# Patient Record
Sex: Female | Born: 1995 | Race: White | Hispanic: No | Marital: Single | Smoking: Former smoker
Health system: Southern US, Community
[De-identification: ages and names within clinical notes are randomized; demographics above are authoritative.]

## PROBLEM LIST (undated history)

## (undated) ENCOUNTER — Inpatient Hospital Stay (HOSPITAL_COMMUNITY): Payer: Self-pay

## (undated) DIAGNOSIS — F419 Anxiety disorder, unspecified: Secondary | ICD-10-CM

## (undated) DIAGNOSIS — F32A Depression, unspecified: Secondary | ICD-10-CM

## (undated) DIAGNOSIS — F329 Major depressive disorder, single episode, unspecified: Secondary | ICD-10-CM

## (undated) DIAGNOSIS — B999 Unspecified infectious disease: Secondary | ICD-10-CM

## (undated) HISTORY — PX: MYRINGOTOMY WITH TUBE PLACEMENT: SHX5663

## (undated) HISTORY — PX: TONSILLECTOMY: SUR1361

---
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
## 2015-01-10 ENCOUNTER — Encounter (HOSPITAL_COMMUNITY): Payer: Self-pay | Admitting: Family Medicine

## 2015-01-10 ENCOUNTER — Emergency Department (HOSPITAL_COMMUNITY)
Admission: EM | Admit: 2015-01-10 | Discharge: 2015-01-10 | Discharge status: Against Medical Advice | Payer: Medicaid Other | Attending: Emergency Medicine | Admitting: Emergency Medicine

## 2015-01-10 DIAGNOSIS — J45909 Unspecified asthma, uncomplicated: Secondary | ICD-10-CM | POA: Diagnosis not present

## 2015-01-10 DIAGNOSIS — Z202 Contact with and (suspected) exposure to infections with a predominantly sexual mode of transmission: Secondary | ICD-10-CM | POA: Diagnosis not present

## 2015-01-10 NOTE — ED Notes (Signed)
Registration states they saw patient leave. Patient LWBS at this time
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
## 2015-01-10 NOTE — ED Notes (Signed)
Patient called to treatment room x1, no answer.  

## 2015-01-20 ENCOUNTER — Emergency Department (HOSPITAL_BASED_OUTPATIENT_CLINIC_OR_DEPARTMENT_OTHER)
Admission: EM | Admit: 2015-01-20 | Discharge: 2015-01-20 | Disposition: A | Discharge status: Home / Self Care | Payer: Medicaid Other | Attending: Physician Assistant | Admitting: Physician Assistant

## 2015-01-20 ENCOUNTER — Emergency Department (HOSPITAL_BASED_OUTPATIENT_CLINIC_OR_DEPARTMENT_OTHER): Payer: Medicaid Other

## 2015-01-20 ENCOUNTER — Encounter (HOSPITAL_BASED_OUTPATIENT_CLINIC_OR_DEPARTMENT_OTHER): Payer: Self-pay | Admitting: Emergency Medicine

## 2015-01-20 DIAGNOSIS — Z202 Contact with and (suspected) exposure to infections with a predominantly sexual mode of transmission: Secondary | ICD-10-CM | POA: Diagnosis not present

## 2015-01-20 DIAGNOSIS — W228XXA Striking against or struck by other objects, initial encounter: Secondary | ICD-10-CM | POA: Diagnosis not present

## 2015-01-20 DIAGNOSIS — Z3202 Encounter for pregnancy test, result negative: Secondary | ICD-10-CM | POA: Diagnosis not present

## 2015-01-20 DIAGNOSIS — J45909 Unspecified asthma, uncomplicated: Secondary | ICD-10-CM | POA: Insufficient documentation

## 2015-01-20 DIAGNOSIS — Y9389 Activity, other specified: Secondary | ICD-10-CM | POA: Insufficient documentation

## 2015-01-20 DIAGNOSIS — Z711 Person with feared health complaint in whom no diagnosis is made: Secondary | ICD-10-CM

## 2015-01-20 DIAGNOSIS — Y9289 Other specified places as the place of occurrence of the external cause: Secondary | ICD-10-CM | POA: Insufficient documentation

## 2015-01-20 DIAGNOSIS — R05 Cough: Secondary | ICD-10-CM

## 2015-01-20 DIAGNOSIS — Y998 Other external cause status: Secondary | ICD-10-CM | POA: Diagnosis not present

## 2015-01-20 DIAGNOSIS — R059 Cough, unspecified: Secondary | ICD-10-CM

## 2015-01-20 DIAGNOSIS — J029 Acute pharyngitis, unspecified: Secondary | ICD-10-CM | POA: Diagnosis not present

## 2015-01-20 DIAGNOSIS — Z8659 Personal history of other mental and behavioral disorders: Secondary | ICD-10-CM | POA: Insufficient documentation

## 2015-01-20 DIAGNOSIS — R042 Hemoptysis: Secondary | ICD-10-CM | POA: Diagnosis present

## 2015-01-20 DIAGNOSIS — S00512A Abrasion of oral cavity, initial encounter: Secondary | ICD-10-CM | POA: Diagnosis not present

## 2015-01-20 LAB — URINALYSIS, ROUTINE W REFLEX MICROSCOPIC
Bilirubin Urine: NEGATIVE
Glucose, UA: NEGATIVE mg/dL
Hgb urine dipstick: NEGATIVE
KETONES UR: NEGATIVE mg/dL
Nitrite: NEGATIVE
PH: 8.5 — AB (ref 5.0–8.0)
Protein, ur: NEGATIVE mg/dL
SPECIFIC GRAVITY, URINE: 1.018 (ref 1.005–1.030)
Urobilinogen, UA: 1 mg/dL (ref 0.0–1.0)

## 2015-01-20 LAB — URINE MICROSCOPIC-ADD ON

## 2015-01-20 LAB — PREGNANCY, URINE: Preg Test, Ur: NEGATIVE

## 2015-01-20 LAB — RAPID STREP SCREEN (MED CTR MEBANE ONLY): Streptococcus, Group A Screen (Direct): NEGATIVE

## 2015-01-20 MED ORDER — BENZONATATE 100 MG PO CAPS: 100.0000 mg | ORAL_CAPSULE | Freq: Three times a day (TID) | ORAL | Status: DC

## 2015-01-20 MED ORDER — ALBUTEROL SULFATE HFA 108 (90 BASE) MCG/ACT IN AERS: 1.0000 | INHALATION_SPRAY | Freq: Four times a day (QID) | RESPIRATORY_TRACT | Status: DC | PRN

## 2015-01-20 MED ORDER — CEFTRIAXONE SODIUM 250 MG IJ SOLR
250.0000 mg | Freq: Once | INTRAMUSCULAR | Status: AC
  Administered 2015-01-20: 250 mg via INTRAMUSCULAR
  Filled 2015-01-20: qty 250

## 2015-01-20 MED ORDER — AZITHROMYCIN 250 MG PO TABS
1000.0000 mg | ORAL_TABLET | Freq: Once | ORAL | Status: AC
  Administered 2015-01-20: 1000 mg via ORAL
  Filled 2015-01-20: qty 4

## 2015-01-20 MED ORDER — LIDOCAINE HCL (PF) 1 % IJ SOLN
INTRAMUSCULAR | Status: AC
  Administered 2015-01-20: 5 mL
  Filled 2015-01-20: qty 5

## 2015-01-20 NOTE — ED Notes (Signed)
Patient states that for the last 2 -3 days she has a had a sore throat. Today she started to have cough with blood at times and reports that she has "bump at the back of my throat"

## 2015-01-20 NOTE — Discharge Instructions (Signed)
Take the prescribed medication as directed. °Follow-up with your primary care physician. °Return to the ED for new or worsening symptoms. ° °
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
## 2015-01-21 LAB — URINE CULTURE: Culture: 6000

## 2015-01-24 LAB — CULTURE, GROUP A STREP: Strep A Culture: NEGATIVE
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
## 2015-08-31 ENCOUNTER — Ambulatory Visit: Payer: Medicaid Other | Admitting: Certified Nurse Midwife

## 2015-10-25 ENCOUNTER — Ambulatory Visit: Payer: Medicaid Other | Admitting: Certified Nurse Midwife

## 2016-11-09 ENCOUNTER — Emergency Department (HOSPITAL_COMMUNITY): Payer: No Typology Code available for payment source

## 2016-11-09 ENCOUNTER — Encounter (HOSPITAL_COMMUNITY): Payer: Self-pay | Admitting: Emergency Medicine

## 2016-11-09 ENCOUNTER — Emergency Department (HOSPITAL_COMMUNITY)
Admission: EM | Admit: 2016-11-09 | Discharge: 2016-11-09 | Disposition: A | Discharge status: Home / Self Care | Payer: No Typology Code available for payment source | Attending: Emergency Medicine | Admitting: Emergency Medicine

## 2016-11-09 DIAGNOSIS — Y998 Other external cause status: Secondary | ICD-10-CM | POA: Insufficient documentation

## 2016-11-09 DIAGNOSIS — F10920 Alcohol use, unspecified with intoxication, uncomplicated: Secondary | ICD-10-CM

## 2016-11-09 DIAGNOSIS — F10129 Alcohol abuse with intoxication, unspecified: Secondary | ICD-10-CM | POA: Insufficient documentation

## 2016-11-09 DIAGNOSIS — S50311A Abrasion of right elbow, initial encounter: Secondary | ICD-10-CM | POA: Diagnosis not present

## 2016-11-09 DIAGNOSIS — Y939 Activity, unspecified: Secondary | ICD-10-CM | POA: Diagnosis not present

## 2016-11-09 DIAGNOSIS — Y906 Blood alcohol level of 120-199 mg/100 ml: Secondary | ICD-10-CM | POA: Insufficient documentation

## 2016-11-09 DIAGNOSIS — R109 Unspecified abdominal pain: Secondary | ICD-10-CM | POA: Insufficient documentation

## 2016-11-09 DIAGNOSIS — J45909 Unspecified asthma, uncomplicated: Secondary | ICD-10-CM | POA: Diagnosis not present

## 2016-11-09 DIAGNOSIS — Y92410 Unspecified street and highway as the place of occurrence of the external cause: Secondary | ICD-10-CM | POA: Diagnosis not present

## 2016-11-09 DIAGNOSIS — R4182 Altered mental status, unspecified: Secondary | ICD-10-CM | POA: Diagnosis present

## 2016-11-09 LAB — COMPREHENSIVE METABOLIC PANEL
ALBUMIN: 3.9 g/dL (ref 3.5–5.0)
ALT: 31 U/L (ref 14–54)
AST: 27 U/L (ref 15–41)
Alkaline Phosphatase: 62 U/L (ref 38–126)
Anion gap: 11 (ref 5–15)
BUN: <5 mg/dL — ABNORMAL LOW (ref 6–20)
CHLORIDE: 109 mmol/L (ref 101–111)
CO2: 20 mmol/L — AB (ref 22–32)
CREATININE: 0.78 mg/dL (ref 0.44–1.00)
Calcium: 8.7 mg/dL — ABNORMAL LOW (ref 8.9–10.3)
GFR calc non Af Amer: >60 mL/min (ref >60)
GLUCOSE: 112 mg/dL — AB (ref 65–99)
Potassium: 3.8 mmol/L (ref 3.5–5.1)
SODIUM: 140 mmol/L (ref 135–145)
Total Bilirubin: 1 mg/dL (ref 0.3–1.2)
Total Protein: 6.7 g/dL (ref 6.5–8.1)

## 2016-11-09 LAB — CBC
HCT: 45.2 % (ref 36.0–46.0)
Hemoglobin: 15 g/dL (ref 12.0–15.0)
MCH: 31.2 pg (ref 26.0–34.0)
MCHC: 33.2 g/dL (ref 30.0–36.0)
MCV: 94 fL (ref 78.0–100.0)
PLATELETS: 284 10*3/uL (ref 150–400)
RBC: 4.81 MIL/uL (ref 3.87–5.11)
RDW: 13.1 % (ref 11.5–15.5)
WBC: 6.2 10*3/uL (ref 4.0–10.5)

## 2016-11-09 LAB — I-STAT BETA HCG BLOOD, ED (MC, WL, AP ONLY): I-stat hCG, quantitative: <5 m[IU]/mL (ref <5)

## 2016-11-09 LAB — ETHANOL: Alcohol, Ethyl (B): 167 mg/dL — ABNORMAL HIGH (ref <5)

## 2016-11-09 MED ORDER — KETOROLAC TROMETHAMINE 30 MG/ML IJ SOLN
30.0000 mg | Freq: Once | INTRAMUSCULAR | Status: AC
  Administered 2016-11-09: 30 mg via INTRAVENOUS
  Filled 2016-11-09: qty 1

## 2016-11-09 MED ORDER — IBUPROFEN 600 MG PO TABS: 600.0000 mg | ORAL_TABLET | Freq: Three times a day (TID) | ORAL | 0 refills | Status: DC | PRN

## 2016-11-09 MED ORDER — IOPAMIDOL (ISOVUE-300) INJECTION 61%
100.0000 mL | Freq: Once | INTRAVENOUS | Status: AC | PRN
  Administered 2016-11-09: 100 mL via INTRAVENOUS

## 2016-11-09 MED ORDER — SODIUM CHLORIDE 0.9 % IV BOLUS (SEPSIS)
1000.0000 mL | Freq: Once | INTRAVENOUS | Status: AC
  Administered 2016-11-09: 1000 mL via INTRAVENOUS

## 2016-11-09 MED ORDER — CYCLOBENZAPRINE HCL 10 MG PO TABS: 10.0000 mg | ORAL_TABLET | Freq: Three times a day (TID) | ORAL | 0 refills | Status: DC | PRN
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
## 2016-11-10 ENCOUNTER — Emergency Department (HOSPITAL_BASED_OUTPATIENT_CLINIC_OR_DEPARTMENT_OTHER): Payer: No Typology Code available for payment source

## 2016-11-10 ENCOUNTER — Encounter (HOSPITAL_BASED_OUTPATIENT_CLINIC_OR_DEPARTMENT_OTHER): Payer: Self-pay

## 2016-11-10 DIAGNOSIS — Z79899 Other long term (current) drug therapy: Secondary | ICD-10-CM | POA: Insufficient documentation

## 2016-11-10 DIAGNOSIS — R51 Headache: Secondary | ICD-10-CM | POA: Insufficient documentation

## 2016-11-10 DIAGNOSIS — J45909 Unspecified asthma, uncomplicated: Secondary | ICD-10-CM | POA: Insufficient documentation

## 2016-11-10 DIAGNOSIS — R112 Nausea with vomiting, unspecified: Secondary | ICD-10-CM | POA: Diagnosis present
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
## 2016-11-11 ENCOUNTER — Emergency Department (HOSPITAL_BASED_OUTPATIENT_CLINIC_OR_DEPARTMENT_OTHER)
Admission: EM | Admit: 2016-11-11 | Discharge: 2016-11-11 | Disposition: A | Discharge status: Home / Self Care | Payer: No Typology Code available for payment source | Attending: Emergency Medicine | Admitting: Emergency Medicine

## 2016-11-11 DIAGNOSIS — R112 Nausea with vomiting, unspecified: Secondary | ICD-10-CM

## 2016-11-11 MED ORDER — SODIUM CHLORIDE 0.9 % IV BOLUS (SEPSIS)
1000.0000 mL | Freq: Once | INTRAVENOUS | Status: AC
  Administered 2016-11-11: 1000 mL via INTRAVENOUS

## 2016-11-11 MED ORDER — DIPHENHYDRAMINE HCL 50 MG/ML IJ SOLN
25.0000 mg | Freq: Once | INTRAMUSCULAR | Status: AC
Start: 2016-11-11 — End: 2016-11-11
  Administered 2016-11-11: 25 mg via INTRAVENOUS
  Filled 2016-11-11: qty 1

## 2016-11-11 MED ORDER — ONDANSETRON 8 MG PO TBDP: 8.0000 mg | ORAL_TABLET | Freq: Three times a day (TID) | ORAL | 0 refills | Status: DC | PRN

## 2016-11-11 MED ORDER — METOCLOPRAMIDE HCL 5 MG/ML IJ SOLN
10.0000 mg | Freq: Once | INTRAMUSCULAR | Status: AC
  Administered 2016-11-11: 10 mg via INTRAVENOUS
  Filled 2016-11-11: qty 2

## 2016-11-11 NOTE — ED Notes (Signed)
ED Provider at bedside. 
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
## 2017-07-24 ENCOUNTER — Emergency Department (HOSPITAL_COMMUNITY): Payer: Self-pay

## 2017-07-24 ENCOUNTER — Emergency Department (HOSPITAL_COMMUNITY)
Admission: EM | Admit: 2017-07-24 | Discharge: 2017-07-25 | Disposition: A | Discharge status: Home / Self Care | Payer: Self-pay | Attending: Emergency Medicine | Admitting: Emergency Medicine

## 2017-07-24 ENCOUNTER — Other Ambulatory Visit: Payer: Self-pay

## 2017-07-24 ENCOUNTER — Encounter (HOSPITAL_COMMUNITY): Payer: Self-pay | Admitting: *Deleted

## 2017-07-24 DIAGNOSIS — Z79899 Other long term (current) drug therapy: Secondary | ICD-10-CM | POA: Insufficient documentation

## 2017-07-24 DIAGNOSIS — R079 Chest pain, unspecified: Secondary | ICD-10-CM | POA: Insufficient documentation

## 2017-07-24 DIAGNOSIS — K0889 Other specified disorders of teeth and supporting structures: Secondary | ICD-10-CM

## 2017-07-24 DIAGNOSIS — J45909 Unspecified asthma, uncomplicated: Secondary | ICD-10-CM | POA: Insufficient documentation

## 2017-07-24 LAB — CBC WITH DIFFERENTIAL/PLATELET
Basophils Absolute: 0 10*3/uL (ref 0.0–0.1)
Basophils Relative: 0 %
Eosinophils Absolute: 0.1 10*3/uL (ref 0.0–0.7)
Eosinophils Relative: 1 %
HEMATOCRIT: 47.4 % — AB (ref 36.0–46.0)
HEMOGLOBIN: 15.8 g/dL — AB (ref 12.0–15.0)
LYMPHS ABS: 2.2 10*3/uL (ref 0.7–4.0)
LYMPHS PCT: 32 %
MCH: 33.3 pg (ref 26.0–34.0)
MCHC: 33.3 g/dL (ref 30.0–36.0)
MCV: 99.8 fL (ref 78.0–100.0)
MONOS PCT: 7 %
Monocytes Absolute: 0.5 10*3/uL (ref 0.1–1.0)
NEUTROS PCT: 60 %
Neutro Abs: 4.2 10*3/uL (ref 1.7–7.7)
Platelets: 313 10*3/uL (ref 150–400)
RBC: 4.75 MIL/uL (ref 3.87–5.11)
RDW: 13.4 % (ref 11.5–15.5)
WBC: 7 10*3/uL (ref 4.0–10.5)

## 2017-07-24 LAB — CBG MONITORING, ED: Glucose-Capillary: 107 mg/dL — ABNORMAL HIGH (ref 65–99)

## 2017-07-24 LAB — COMPREHENSIVE METABOLIC PANEL
ALT: 23 U/L (ref 14–54)
ANION GAP: 9 (ref 5–15)
AST: 19 U/L (ref 15–41)
Albumin: 3.9 g/dL (ref 3.5–5.0)
Alkaline Phosphatase: 64 U/L (ref 38–126)
BUN: 6 mg/dL (ref 6–20)
CHLORIDE: 107 mmol/L (ref 101–111)
CO2: 29 mmol/L (ref 22–32)
Calcium: 9.5 mg/dL (ref 8.9–10.3)
Creatinine, Ser: 0.85 mg/dL (ref 0.44–1.00)
GFR calc non Af Amer: >60 mL/min (ref >60)
Glucose, Bld: 96 mg/dL (ref 65–99)
POTASSIUM: 4.2 mmol/L (ref 3.5–5.1)
SODIUM: 145 mmol/L (ref 135–145)
Total Bilirubin: 0.8 mg/dL (ref 0.3–1.2)
Total Protein: 7 g/dL (ref 6.5–8.1)

## 2017-07-24 LAB — I-STAT TROPONIN, ED: TROPONIN I, POC: 0 ng/mL (ref 0.00–0.08)

## 2017-07-24 LAB — LIPASE, BLOOD: Lipase: 26 U/L (ref 11–51)

## 2017-07-24 MED ORDER — GI COCKTAIL ~~LOC~~
30.0000 mL | Freq: Once | ORAL | Status: AC
  Administered 2017-07-24: 30 mL via ORAL
  Filled 2017-07-24: qty 30

## 2017-07-24 MED ORDER — ONDANSETRON HCL 4 MG/2ML IJ SOLN: 4.0000 mg | Freq: Once | INTRAMUSCULAR | Status: DC

## 2017-07-24 MED ORDER — FAMOTIDINE IN NACL 20-0.9 MG/50ML-% IV SOLN
20.0000 mg | Freq: Once | INTRAVENOUS | Status: AC
  Administered 2017-07-24: 20 mg via INTRAVENOUS
  Filled 2017-07-24: qty 50

## 2017-07-24 MED ORDER — MORPHINE SULFATE (PF) 4 MG/ML IV SOLN
4.0000 mg | Freq: Once | INTRAVENOUS | Status: AC
  Administered 2017-07-24: 4 mg via INTRAVENOUS
  Filled 2017-07-24: qty 1

## 2017-07-24 MED ORDER — SODIUM CHLORIDE 0.9 % IV BOLUS
1000.0000 mL | Freq: Once | INTRAVENOUS | Status: AC
  Administered 2017-07-24: 1000 mL via INTRAVENOUS

## 2017-07-24 MED ORDER — OXYCODONE-ACETAMINOPHEN 5-325 MG PO TABS
1.0000 | ORAL_TABLET | Freq: Once | ORAL | Status: AC
  Administered 2017-07-24: 1 via ORAL
  Filled 2017-07-24: qty 1

## 2017-07-24 MED ORDER — ONDANSETRON 4 MG PO TBDP
4.0000 mg | ORAL_TABLET | Freq: Once | ORAL | Status: AC
  Administered 2017-07-24: 4 mg via ORAL
  Filled 2017-07-24: qty 1

## 2017-07-24 NOTE — ED Provider Notes (Signed)
Patient placed in Quick Look pathway, seen and evaluated   Chief Complaint: chest pain  HPI:   Pt reports multiple issues including dental pain (x 4 months), nausea/vomiting (x several weeks), chest pain onset last night that brought her into ER for evaluation.  Endorses left upper/lower dental pain for 4 months, acutely worsening for 1 week. In last 4 months has been taking daily ibuprofen, tylenol, goody powder without relief.  Intermittently vomiting last several weeks, nausea worst in morning, vomited 4 times yesterday. CP onset last night after eating dinner while walking described as sharp, intermittent, worse with taking deep breaths, happened again this morning while getting ready for work.   ROS: positive: chest pain, nausea, vomiting, light-headedness, dental pain Negative: fevers, hematemesis, melena, hematochezia, cough.   Physical Exam:   Gen: No distress  Neuro: Awake and Alert  Skin: Warm    Focused Exam: RRR. 2+ radial and DP pulses bilaterally. Lungs CTAB. Abdomen soft, non distended with epigastric tenderness.   Ddx includes NSAID induced gastritis/GERD/PUD, pancreatitis, cholecystitis.  Less likely ACS. PERC negative. Screening labs, imaging, EKG ordered. Analgesia and antiemetics provided.  Initiation of care has begun. The patient has been counseled on the process, plan, and necessity for staying for the completion/evaluation, and the remainder of the medical screening examination    Jerrell Mylar 07/24/17 1756    Nira Conn, MD 07/25/17 [phone]

## 2017-07-24 NOTE — ED Triage Notes (Signed)
The pt is c/o a toothache for 3 months  Worse for one week  lmp  Last week

## 2017-07-24 NOTE — ED Notes (Signed)
Xray came over to get a portable xray  The pt was sent back to the waiting room after she was not appropriate for fast track  2-way chest ordered instead

## 2017-07-25 LAB — I-STAT TROPONIN, ED: Troponin i, poc: 0 ng/mL (ref 0.00–0.08)

## 2017-07-25 LAB — I-STAT BETA HCG BLOOD, ED (MC, WL, AP ONLY)

## 2017-07-25 MED ORDER — OMEPRAZOLE 20 MG PO CPDR: 20.0000 mg | DELAYED_RELEASE_CAPSULE | Freq: Every day | ORAL | 0 refills | Status: DC

## 2017-07-25 MED ORDER — CLINDAMYCIN HCL 150 MG PO CAPS
300.0000 mg | ORAL_CAPSULE | Freq: Once | ORAL | Status: AC
  Administered 2017-07-25: 300 mg via ORAL
  Filled 2017-07-25: qty 2

## 2017-07-25 MED ORDER — LIDOCAINE VISCOUS 2 % MT SOLN: 20.0000 mL | OROMUCOSAL | 0 refills | Status: DC | PRN

## 2017-07-25 MED ORDER — TRAMADOL HCL 50 MG PO TABS: 50.0000 mg | ORAL_TABLET | Freq: Four times a day (QID) | ORAL | 0 refills | Status: DC | PRN

## 2017-07-25 MED ORDER — SUCRALFATE 1 G PO TABS: 1.0000 g | ORAL_TABLET | Freq: Three times a day (TID) | ORAL | 0 refills | Status: DC

## 2017-07-25 MED ORDER — CLINDAMYCIN HCL 300 MG PO CAPS: 300.0000 mg | ORAL_CAPSULE | Freq: Three times a day (TID) | ORAL | 0 refills | Status: DC

## 2017-07-25 MED ORDER — ONDANSETRON 4 MG PO TBDP: 4.0000 mg | ORAL_TABLET | Freq: Three times a day (TID) | ORAL | 0 refills | Status: DC | PRN

## 2017-07-25 NOTE — ED Provider Notes (Signed)
[facility] EMERGENCY DEPARTMENT Provider Note   CSN: [id number] Arrival date & time: 07/24/17  1540     History   Chief Complaint Chief Complaint  Patient presents with  . Chest Pain    HPI Cheryl Henry is a 22 y.o. female.  HPI 22 year old Caucasian female past medical history significant for asthma and depression presents to the emergency department today initially for dental pain.  Patient reports pain in her left upper and lower molar that has been ongoing for the past 4 months.  Patient does not have a dentist.  She denies any difficulties breathing or swallowing.  Denies any associated fevers or facial swelling.  Is states that she has been taking several Goody powders, Motrin, ibuprofen and Tylenol without any relief of her symptoms.  In triage seeing another provider she started complaining of chest pain.  Patient reports chest pain that started yesterday after having anxiety.  She states that it happened after eating dinner.  Reports that sharp and intermittent.  Reports worse with taking deep breaths.  She states that this resolved last night and she was able to get 1 hour of sleep.  States that it returned this morning while she was getting ready for work.  The chest pain is not exertional.  Reports some shortness of breath but denies any at this time.  Denies any radiation of the pain.  The pain was across the anterior chest.  She reports associated nausea and vomiting.  Denies any associated diaphoresis.  No history of cardiac disease.  Patient denies OCP use, hormone therapy, history of DVT/PE, prolonged immobilization, recent hospitalization/surgeries, unilateral leg swelling or calf tenderness, hemoptysis, tobacco use.  Nothing makes her symptoms better or worse.  She has not taken anything for her chest pain today.  Patient seems more concerned about her dental pain and keeps redirecting the conversation back to her dental pain.  Nuys any change in her  bowel habits, urinary symptoms or vaginal symptoms.  Past Medical History:  Diagnosis Date  . Asthma   . Depression     Patient Active Problem List   Diagnosis Date Noted  . Missed period 10/31/2013  . Absence of menstruation 10/31/2013  . Dysmenorrhea 05/31/2013  . Vaginitis and vulvovaginitis, unspecified 05/31/2013  . Acetaminophen overdose 06/15/2012  . Phenytoin overdose 06/15/2012  . Mild intermittent asthma 06/15/2012    Past Surgical History:  Procedure Laterality Date  . MYRINGOTOMY WITH TUBE PLACEMENT    . TONSILLECTOMY       OB History    Gravida  0   Para  0   Term  0   Preterm  0   AB  0   Living  0     SAB  0   TAB  0   Ectopic  0   Multiple  0   Live Births               Home Medications    Prior to Admission medications   Medication Sig Start Date End Date Taking? Authorizing Provider  acetaminophen (TYLENOL) 500 MG tablet Take 1,000 mg by mouth every 6 (six) hours as needed (tooth pain).   Yes [provider]  Aspirin-Acetaminophen-Caffeine (GOODY HEADACHE PO) Take 1 packet by mouth daily as needed (tooth pain).   Yes [provider]  Cyanocobalamin (VITAMIN B12 PO) Take 1 tablet by mouth 2 (two) times a week.   Yes [provider]  ibuprofen (ADVIL,MOTRIN) 200 MG tablet Take  800-1,000 mg by mouth every 6 (six) hours as needed (tooth pain).   Yes [provider]  clindamycin (CLEOCIN) 300 MG capsule Take 1 capsule (300 mg total) by mouth 3 (three) times daily. 07/25/17   Rise Mu, PA-C  cyclobenzaprine (FLEXERIL) 10 MG tablet Take 1 tablet (10 mg total) by mouth 3 (three) times daily as needed for muscle spasms. Patient not taking: Reported on 07/24/2017 11/09/16   Azalia Bilis, MD  ibuprofen (ADVIL,MOTRIN) 600 MG tablet Take 1 tablet (600 mg total) by mouth every 8 (eight) hours as needed. Patient not taking: Reported on 07/24/2017 11/09/16   Azalia Bilis, MD  lidocaine (XYLOCAINE) 2 %  solution Use as directed 20 mLs in the mouth or throat as needed for mouth pain. 07/25/17   Rise Mu, PA-C  omeprazole (PRILOSEC) 20 MG capsule Take 1 capsule (20 mg total) by mouth daily. 07/25/17   Rise Mu, PA-C  ondansetron (ZOFRAN ODT) 4 MG disintegrating tablet Take 1 tablet (4 mg total) by mouth every 8 (eight) hours as needed for nausea or vomiting. 07/25/17   Chimaobi Casebolt, Lynann Beaver, PA-C  sucralfate (CARAFATE) 1 g tablet Take 1 tablet (1 g total) by mouth 4 (four) times daily -  with meals and at bedtime. 07/25/17   Rise Mu, PA-C  traMADol (ULTRAM) 50 MG tablet Take 1 tablet (50 mg total) by mouth every 6 (six) hours as needed. 07/25/17   Rise Mu, PA-C    Family History Family History  Problem Relation Age of Onset  . Bipolar disorder Sister     Social History Social History   Tobacco Use  . Smoking status: Never Smoker  . Smokeless tobacco: Never Used  Substance Use Topics  . Alcohol use: No  . Drug use: No     Allergies   Prednisone   Review of Systems Review of Systems  All other systems reviewed and are negative.    Physical Exam Updated Vital Signs BP 124/89 (BP Location: Right Arm)   Pulse 87   Temp 98.5 F (36.9 C) (Oral)   Resp 16   Ht 5' 2.5" (1.588 m)   Wt 86.2 kg (190 lb)   LMP 07/21/2017 Comment: pt shielded  SpO2 100%   BMI 34.20 kg/m   Physical Exam  Constitutional: She is oriented to person, place, and time. She appears well-developed and well-nourished.  Non-toxic appearance. No distress.  HENT:  Head: Normocephalic and atraumatic.  Nose: Nose normal.  Mouth/Throat: Uvula is midline and oropharynx is clear and moist. No trismus in the jaw. No uvula swelling.    Poor dentition throughout.  Several dental caries noted.  No gingival edema noted.  No gross abscess.  There is no purulent drainage.  Tender to palpation over the left upper and lower fourth molar.  No trismus noted.  No sublingual or  submandibular swelling.  Managing secretions tolerating airway.  Facial swelling noted.  Eyes: Pupils are equal, round, and reactive to light. Conjunctivae are normal. Right eye exhibits no discharge. Left eye exhibits no discharge.  Neck: Normal range of motion. Neck supple. No JVD present. No tracheal deviation present.  Cardiovascular: Normal rate, regular rhythm, normal heart sounds and intact distal pulses. Exam reveals no gallop and no friction rub.  No murmur heard. Pulmonary/Chest: Effort normal and breath sounds normal. No stridor. No respiratory distress. She has no decreased breath sounds. She has no wheezes. She has no rhonchi. She has no rales. She exhibits no  tenderness.  No hypoxia or tachypnea.  Abdominal: Soft. Normal appearance and bowel sounds are normal. She exhibits no distension. There is tenderness in the epigastric area. There is no rigidity, no rebound, no guarding, no CVA tenderness, no tenderness at McBurney's point and negative Murphy's sign.  Musculoskeletal: Normal range of motion.       Right lower leg: Normal.       Left lower leg: Normal.  No lower extremity edema or calf tenderness.  Lymphadenopathy:    She has no cervical adenopathy.  Neurological: She is alert and oriented to person, place, and time.  Skin: Skin is warm and dry. Capillary refill takes less than 2 seconds. She is not diaphoretic.  Psychiatric: Her behavior is normal. Judgment and thought content normal.  Nursing note and vitals reviewed.    ED Treatments / Results  Labs (all labs ordered are listed, but only abnormal results are displayed) Labs Reviewed  CBC WITH DIFFERENTIAL/PLATELET - Abnormal; Notable for the following components:      Result Value   Hemoglobin 15.8 (*)    HCT 47.4 (*)    All other components within normal limits  CBG MONITORING, ED - Abnormal; Notable for the following components:   Glucose-Capillary 107 (*)    All other components within normal limits    COMPREHENSIVE METABOLIC PANEL  LIPASE, BLOOD  I-STAT TROPONIN, ED  I-STAT BETA HCG BLOOD, ED ([facility], [facility], [facility] ONLY)  I-STAT TROPONIN, ED    EKG EKG Interpretation  Date/Time:  Friday Jul 24 2017 23:13:55 EDT Ventricular Rate:  83 PR Interval:  126 QRS Duration: 84 QT Interval:  382 QTC Calculation: 448 R Axis:   72 Text Interpretation:  Normal sinus rhythm with sinus arrhythmia Normal ECG No significant change was found Confirmed by Azalia Bilis ([id number]) on 07/25/2017 12:33:11 AM   Radiology Dg Chest 2 View  Result Date: 07/24/2017 CLINICAL DATA:  Chest pain after taking too much Motrin over the past month for toothache. EXAM: CHEST - 2 VIEW COMPARISON:  11/09/2016 FINDINGS: The heart size and mediastinal contours are within normal limits. Both lungs are clear. The visualized skeletal structures are unremarkable. IMPRESSION: No active cardiopulmonary disease. Electronically Signed   By: Tollie Eth M.D.   On: 07/24/2017 19:55    Procedures Procedures (including critical care time)  Medications Ordered in ED Medications  clindamycin (CLEOCIN) capsule 300 mg (has no administration in time range)  oxyCODONE-acetaminophen (PERCOCET/ROXICET) 5-325 MG per tablet 1 tablet (1 tablet Oral Given 07/24/17 1803)  ondansetron (ZOFRAN-ODT) disintegrating tablet 4 mg (4 mg Oral Given 07/24/17 1803)  gi cocktail (Maalox,Lidocaine,Donnatal) (30 mLs Oral Given 07/24/17 1803)  morphine 4 MG/ML injection 4 mg (4 mg Intravenous Given 07/24/17 2345)  sodium chloride 0.9 % bolus 1,000 mL (0 mLs Intravenous Stopped 07/25/17 0056)  famotidine (PEPCID) IVPB 20 mg premix (0 mg Intravenous Stopped 07/25/17 0055)  gi cocktail (Maalox,Lidocaine,Donnatal) (30 mLs Oral Given by Other 07/24/17 2315)     Initial Impression / Assessment and Plan / ED Course  I have reviewed the triage vital signs and the nursing notes.  Pertinent labs & imaging results that were available during my care of the patient were reviewed  by me and considered in my medical decision making (see chart for details).     Pt presents to the Ed today with complaints of cp. Patient is to be discharged with recommendation to follow up with PCP in regards to today's hospital visit. Chest pain is not likely of cardiac  or pulmonary etiology d/t presentation, perc negative, discussed d-dimer with patient and family who would like to avoid at this time, VSS, no tracheal deviation, no JVD or new murmur, RRR, breath sounds equal bilaterally, EKG without any change from prior tracing and shows no signs of ischemia, negative trop (heart pathway score 1), and negative CXR.   Presentation does not seem consistent with PE, dissection, pneumonia or ACS.  I suspect the patient's chest pain and epigastric pain is related to possible gastritis induced by NSAID use.  Chest x-ray was normal.  No signs of free air.  Patient has no peritoneal signs.  Doubt perforation.  Lipase was normal.  Doubt pancreatitis.  Patient has no right upper quadrant pain doubt cholecystitis.  Patient denies urinary symptoms.  Doubt pyelo-or UTI.  Patient's pain and nausea improved with medications.  Repeat abdominal exam was benign and patient states that her chest pain has completely resolved.  She complains about dental pain still.  Patient with toothache.  No gross abscess.  Exam unconcerning for Ludwig's angina or spread of infection.  Will treat with penicillin and pain medicine.  Urged patient to follow-up with dentist.     Pt has been advised to return to the ED is CP becomes exertional, associated with diaphoresis or nausea, radiates to left jaw/arm, worsens or becomes concerning in any way. Pt appears reliable for follow up and is agreeable to discharge.   Pt is hemodynamically stable, in NAD, & able to ambulate in the ED. Evaluation does not show pathology that would require ongoing emergent intervention or inpatient treatment. I explained the diagnosis to the patient. Pain  has been managed & has no complaints prior to dc. Pt is comfortable with above plan and is stable for discharge at this time. All questions were answered prior to disposition. Strict return precautions for f/u to the ED were discussed. Encouraged follow up with PCP.     Final Clinical Impressions(s) / ED Diagnoses   Final diagnoses:  Nonspecific chest pain  Pain, dental    ED Discharge Orders        Ordered    traMADol (ULTRAM) 50 MG tablet  Every 6 hours PRN     07/25/17 0038    lidocaine (XYLOCAINE) 2 % solution  As needed     07/25/17 0038    clindamycin (CLEOCIN) 300 MG capsule  3 times daily     07/25/17 0038    sucralfate (CARAFATE) 1 g tablet  3 times daily with meals & bedtime     07/25/17 0038    omeprazole (PRILOSEC) 20 MG capsule  Daily     07/25/17 0038    ondansetron (ZOFRAN ODT) 4 MG disintegrating tablet  Every 8 hours PRN     07/25/17 0038       Rise Mu, PA-C 07/25/17 0106    Azalia Bilis, MD 07/25/17 1014

## 2017-07-25 NOTE — Discharge Instructions (Addendum)
Your work-up has been very reassuring in the ED.  I suspect that your chest pain and abdominal pain is secondary to gastritis caused by NSAIDs.  I recommend to avoid NSAIDs at this time.  Have given you Carafate to take before meals.  Also given you Prilosec to take once a day to help with acid reduction.   Your abdominal pain is likely from gastritis, reflux or a stomach ulcer. You will need to take the prescribed proton pump inhibitor as directed, and avoid spicy/fatty/acidic foods. Avoid laying down flat within 30 minutes of eating. Avoid NSAIDs like ibuprofen or Aleve on an empty stomach. Use zofran as needed for nausea. Follow up with the gastroenterologist (GI doctor) listed for ongoing evaluation of your abdominal pain. Return to the ER for new or worsening symptoms, any additional concers.  If you develop new or worsening chest pain, shortness of breath, vomiting up blood, bloody stools return to ED immediately.   You have a dental injury/infection. It is very important that you get evaluated by a dentist as soon as possible. Call tomorrow to schedule an appointment. ylenol as needed for pain. Take your full course of antibiotics. Read the instructions below.  Her course of tramadol for pain which make you drowsy so do not drive with it.  Also given you some lidocaine to rinsing her mouth to help with your dental pain.  Eat a soft or liquid diet and rinse your mouth out after meals with warm water. You should see a dentist or return here at once if you have increased swelling, increased pain or uncontrolled bleeding from the site of your injury.  SEEK MEDICAL CARE IF:  You have increased pain not controlled with medicines.  You have swelling around your tooth, in your face or neck.  You have bleeding which starts, continues, or gets worse.  You have a fever >101 If you are unable to open your mouth   SEEK IMMEDIATE MEDICAL ATTENTION IF YOU DEVELOP ANY OF THE FOLLOWING SYMPTOMS: The pain  does not go away or becomes severe.  A temperature above 101 develops.  Repeated vomiting occurs (multiple episodes).  Blood is being passed in stools or vomit (bright red or black tarry stools).  Return also if you develop chest pain, difficulty breathing, dizziness or fainting

## 2017-09-14 ENCOUNTER — Emergency Department (HOSPITAL_COMMUNITY): Payer: Self-pay

## 2017-09-14 ENCOUNTER — Other Ambulatory Visit: Payer: Self-pay

## 2017-09-14 ENCOUNTER — Emergency Department (HOSPITAL_COMMUNITY)
Admission: EM | Admit: 2017-09-14 | Discharge: 2017-09-14 | Disposition: A | Discharge status: Home / Self Care | Payer: Self-pay | Attending: Emergency Medicine | Admitting: Emergency Medicine

## 2017-09-14 ENCOUNTER — Encounter (HOSPITAL_COMMUNITY): Payer: Self-pay

## 2017-09-14 DIAGNOSIS — O9952 Diseases of the respiratory system complicating childbirth: Secondary | ICD-10-CM | POA: Insufficient documentation

## 2017-09-14 DIAGNOSIS — O218 Other vomiting complicating pregnancy: Secondary | ICD-10-CM | POA: Insufficient documentation

## 2017-09-14 DIAGNOSIS — Z3490 Encounter for supervision of normal pregnancy, unspecified, unspecified trimester: Secondary | ICD-10-CM

## 2017-09-14 DIAGNOSIS — Z3A00 Weeks of gestation of pregnancy not specified: Secondary | ICD-10-CM | POA: Insufficient documentation

## 2017-09-14 DIAGNOSIS — R102 Pelvic and perineal pain: Secondary | ICD-10-CM | POA: Insufficient documentation

## 2017-09-14 DIAGNOSIS — Z79899 Other long term (current) drug therapy: Secondary | ICD-10-CM | POA: Insufficient documentation

## 2017-09-14 LAB — COMPREHENSIVE METABOLIC PANEL
ALBUMIN: 3.9 g/dL (ref 3.5–5.0)
ALK PHOS: 60 U/L (ref 38–126)
ALT: 24 U/L (ref 0–44)
AST: 19 U/L (ref 15–41)
Anion gap: 9 (ref 5–15)
BILIRUBIN TOTAL: 0.8 mg/dL (ref 0.3–1.2)
BUN: 6 mg/dL (ref 6–20)
CALCIUM: 9.2 mg/dL (ref 8.9–10.3)
CO2: 26 mmol/L (ref 22–32)
Chloride: 107 mmol/L (ref 98–111)
Creatinine, Ser: 0.65 mg/dL (ref 0.44–1.00)
GFR calc Af Amer: >60 mL/min (ref >60)
GFR calc non Af Amer: >60 mL/min (ref >60)
GLUCOSE: 92 mg/dL (ref 70–99)
Potassium: 3.6 mmol/L (ref 3.5–5.1)
Sodium: 142 mmol/L (ref 135–145)
TOTAL PROTEIN: 7.2 g/dL (ref 6.5–8.1)

## 2017-09-14 LAB — I-STAT BETA HCG BLOOD, ED (MC, WL, AP ONLY): I-stat hCG, quantitative: >2000 m[IU]/mL — ABNORMAL HIGH (ref <5)

## 2017-09-14 LAB — URINALYSIS, ROUTINE W REFLEX MICROSCOPIC
BILIRUBIN URINE: NEGATIVE
Glucose, UA: NEGATIVE mg/dL
HGB URINE DIPSTICK: NEGATIVE
KETONES UR: NEGATIVE mg/dL
Leukocytes, UA: NEGATIVE
NITRITE: NEGATIVE
Protein, ur: NEGATIVE mg/dL
Specific Gravity, Urine: 1.014 (ref 1.005–1.030)
pH: 7 (ref 5.0–8.0)

## 2017-09-14 LAB — CBC
HEMATOCRIT: 44.9 % (ref 36.0–46.0)
Hemoglobin: 15.2 g/dL — ABNORMAL HIGH (ref 12.0–15.0)
MCH: 33 pg (ref 26.0–34.0)
MCHC: 33.9 g/dL (ref 30.0–36.0)
MCV: 97.6 fL (ref 78.0–100.0)
Platelets: 261 10*3/uL (ref 150–400)
RBC: 4.6 MIL/uL (ref 3.87–5.11)
RDW: 12.5 % (ref 11.5–15.5)
WBC: 5.7 10*3/uL (ref 4.0–10.5)

## 2017-09-14 LAB — HCG, QUANTITATIVE, PREGNANCY: hCG, Beta Chain, Quant, S: 29417 m[IU]/mL — ABNORMAL HIGH (ref <5)

## 2017-09-14 LAB — LIPASE, BLOOD: Lipase: 22 U/L (ref 11–51)

## 2017-09-14 MED ORDER — SODIUM CHLORIDE 0.9 % IV BOLUS
1000.0000 mL | Freq: Once | INTRAVENOUS | Status: AC
  Administered 2017-09-14: 1000 mL via INTRAVENOUS

## 2017-09-14 MED ORDER — PRENATAL COMPLETE 14-0.4 MG PO TABS: 1.0000 | ORAL_TABLET | Freq: Every day | ORAL | 0 refills | Status: DC

## 2017-09-14 NOTE — ED Notes (Signed)
Unable to collect labs at this time the motion patient sitting in and I did not see a vein to collect from

## 2017-09-14 NOTE — ED Provider Notes (Signed)
[facility]-EMERGENCY DEPT Provider Note   CSN: [id number] Arrival date & time: 09/14/17  1407     History   Chief Complaint Chief Complaint  Patient presents with  . Headache  . Emesis    HPI Cheryl Henry is a 22 y.o. female who presents to ED for evaluation of 1 month history of intermittent nausea and vomiting.  Reports associated lower abdominal cramping.  She has not had a menstrual period in almost 2 months.  She is concerned that she may be pregnant.  Her first pregnancy resulted in spontaneous abortion approximately 6 years ago.  She states that she constantly feels nauseous but only intermittently has nonbloody, nonbilious emesis.  She cannot recall any inciting event that may have triggered the symptoms.  She also reports headache but states that "it is nothing, I think you will just go away with medicine but I don't know what to take if I could be pregnant."  She has not tried any other medicine to help with her nausea.  She reports normal bowel movement yesterday.  Denies any fever, dysuria, hematemesis, sick contacts with similar symptoms, vision changes, head injuries or falls, numbness in arms or legs, history of aneurysms, neck pain, vaginal bleeding, vaginal discharge. Reports occasional alcohol use but denies any tobacco or other drug use.  Denies any prior abdominal surgeries.  Denies chronic NSAID use.  HPI  Past Medical History:  Diagnosis Date  . Asthma   . Depression     Patient Active Problem List   Diagnosis Date Noted  . Missed period 10/31/2013  . Absence of menstruation 10/31/2013  . Dysmenorrhea 05/31/2013  . Vaginitis and vulvovaginitis, unspecified 05/31/2013  . Acetaminophen overdose 06/15/2012  . Phenytoin overdose 06/15/2012  . Mild intermittent asthma 06/15/2012    Past Surgical History:  Procedure Laterality Date  . MYRINGOTOMY WITH TUBE PLACEMENT    . TONSILLECTOMY       OB History    Gravida  0   Para  0   Term   0   Preterm  0   AB  0   Living  0     SAB  0   TAB  0   Ectopic  0   Multiple  0   Live Births               Home Medications    Prior to Admission medications   Medication Sig Start Date End Date Taking? Authorizing Provider  Cyanocobalamin (VITAMIN B12 PO) Take 1 tablet by mouth 2 (two) times a week.   Yes [provider]  ibuprofen (ADVIL,MOTRIN) 200 MG tablet Take 800-1,000 mg by mouth every 6 (six) hours as needed (tooth pain).   Yes [provider]  clindamycin (CLEOCIN) 300 MG capsule Take 1 capsule (300 mg total) by mouth 3 (three) times daily. Patient not taking: Reported on 09/14/2017 07/25/17   Demetrios Loll T, PA-C  cyclobenzaprine (FLEXERIL) 10 MG tablet Take 1 tablet (10 mg total) by mouth 3 (three) times daily as needed for muscle spasms. Patient not taking: Reported on 09/14/2017 11/09/16   Azalia Bilis, MD  ibuprofen (ADVIL,MOTRIN) 600 MG tablet Take 1 tablet (600 mg total) by mouth every 8 (eight) hours as needed. Patient not taking: Reported on 09/14/2017 11/09/16   Azalia Bilis, MD  lidocaine (XYLOCAINE) 2 % solution Use as directed 20 mLs in the mouth or throat as needed for mouth pain. Patient not taking: Reported on 09/14/2017 07/25/17  Rise Mu, PA-C  omeprazole (PRILOSEC) 20 MG capsule Take 1 capsule (20 mg total) by mouth daily. Patient not taking: Reported on 09/14/2017 07/25/17   Demetrios Loll T, PA-C  ondansetron (ZOFRAN ODT) 4 MG disintegrating tablet Take 1 tablet (4 mg total) by mouth every 8 (eight) hours as needed for nausea or vomiting. Patient not taking: Reported on 09/14/2017 07/25/17   Rise Mu, PA-C  Prenatal Vit-Fe Fumarate-FA (PRENATAL COMPLETE) 14-0.4 MG TABS Take 1 tablet by mouth daily. 09/14/17   Maiyah Goyne, PA-C  sucralfate (CARAFATE) 1 g tablet Take 1 tablet (1 g total) by mouth 4 (four) times daily -  with meals and at bedtime. Patient not taking: Reported on 09/14/2017 07/25/17   Demetrios Loll T, PA-C  traMADol (ULTRAM) 50 MG tablet Take 1 tablet (50 mg total) by mouth every 6 (six) hours as needed. Patient not taking: Reported on 09/14/2017 07/25/17   Rise Mu, PA-C    Family History Family History  Problem Relation Age of Onset  . Bipolar disorder Sister     Social History Social History   Tobacco Use  . Smoking status: Never Smoker  . Smokeless tobacco: Never Used  Substance Use Topics  . Alcohol use: No  . Drug use: No     Allergies   Prednisone   Review of Systems Review of Systems  Constitutional: Negative for appetite change, chills and fever.  HENT: Negative for ear pain, rhinorrhea, sneezing and sore throat.   Eyes: Negative for photophobia and visual disturbance.  Respiratory: Negative for cough, chest tightness, shortness of breath and wheezing.   Cardiovascular: Negative for chest pain and palpitations.  Gastrointestinal: Positive for abdominal pain, nausea and vomiting. Negative for blood in stool, constipation and diarrhea.  Genitourinary: Negative for dysuria, hematuria, urgency, vaginal bleeding, vaginal discharge and vaginal pain.  Musculoskeletal: Negative for myalgias and neck pain.  Skin: Negative for rash.  Neurological: Positive for headaches. Negative for dizziness, weakness and light-headedness.     Physical Exam Updated Vital Signs BP 100/83 (BP Location: Right Arm)   Pulse 91   Temp 98 F (36.7 C) (Oral)   Resp 18   Ht 5' 2.5" (1.588 m)   Wt 81.2 kg (179 lb)   LMP 07/20/2017   SpO2 97%   BMI 32.22 kg/m   Physical Exam  Constitutional: She is oriented to person, place, and time. She appears well-developed and well-nourished. No distress.  HENT:  Head: Normocephalic and atraumatic.  Nose: Nose normal.  Eyes: Pupils are equal, round, and reactive to light. Conjunctivae and EOM are normal. Right eye exhibits no discharge. Left eye exhibits no discharge. No scleral icterus.  Neck: Normal range of motion.  Neck supple.  No meningismus.  Cardiovascular: Normal rate, regular rhythm, normal heart sounds and intact distal pulses. Exam reveals no gallop and no friction rub.  No murmur heard. Pulmonary/Chest: Effort normal and breath sounds normal. No respiratory distress.  Abdominal: Soft. Bowel sounds are normal. She exhibits no distension. There is tenderness. There is no guarding.  Mild suprapubic TTP with no rebound or guarding noted.  Musculoskeletal: Normal range of motion. She exhibits no edema.  Neurological: She is alert and oriented to person, place, and time. No cranial nerve deficit or sensory deficit. She exhibits normal muscle tone. Coordination normal.  Pupils reactive. No facial asymmetry noted. Cranial nerves appear grossly intact. Sensation intact to light touch on face.  Skin: Skin is warm and dry. No rash noted.  Psychiatric: She has a normal mood and affect.  Nursing note and vitals reviewed.    ED Treatments / Results  Labs (all labs ordered are listed, but only abnormal results are displayed) Labs Reviewed  CBC - Abnormal; Notable for the following components:      Result Value   Hemoglobin 15.2 (*)    All other components within normal limits  I-STAT BETA HCG BLOOD, ED ([facility], [facility], [facility] ONLY) - Abnormal; Notable for the following components:   I-stat hCG, quantitative >2,000.0 (*)    All other components within normal limits  LIPASE, BLOOD  COMPREHENSIVE METABOLIC PANEL  URINALYSIS, ROUTINE W REFLEX MICROSCOPIC  HCG, QUANTITATIVE, PREGNANCY    EKG None  Radiology [place] Ob Comp < 14 Wks  Result Date: 09/14/2017 CLINICAL DATA:  Initial evaluation for acute pelvic pain for several days. Early pregnancy. EXAM: OBSTETRIC <14 WK US AND TRANSVAGINAL OB US TECHNIQUE: Both transabdominal and transvaginal ultrasound examinations were performed for complete evaluation of the gestation as well as the maternal uterus, adnexal regions, and pelvic cul-de-sac. Transvaginal technique was  performed to assess early pregnancy. COMPARISON:  None. FINDINGS: Intrauterine gestational sac: Single. Yolk sac:  Not visualized. Embryo:  Not visualized. Cardiac Activity: N/A Heart Rate: N/A bpm MSD: 15.9 mm   6 w   3 d Subchorionic hemorrhage:  None visualized. Maternal uterus/adnexae: Right ovary not visualized. Left ovary normal in appearance. Trace free physiologic fluid within the pelvis. IMPRESSION: 1. Probable early intrauterine gestational sac, but no yolk sac, fetal pole, or cardiac activity yet visualized. Recommend follow-up quantitative B-HCG levels and follow-up US in 14 days to assess viability. This recommendation follows SRU consensus guidelines: Diagnostic Criteria for Nonviable Pregnancy Early in the First Trimester. Malva Limes Engl J Med 2013; 161:0960-45; 369:1443-51. 2. No other acute maternal uterine or adnexal abnormality identified. Electronically Signed   By: Rise MuBenjamin  McClintock M.D.   On: 09/14/2017 18:11   [place] Ob Transvaginal  Result Date: 09/14/2017 CLINICAL DATA:  Initial evaluation for acute pelvic pain for several days. Early pregnancy. EXAM: OBSTETRIC <14 WK US AND TRANSVAGINAL OB US TECHNIQUE: Both transabdominal and transvaginal ultrasound examinations were performed for complete evaluation of the gestation as well as the maternal uterus, adnexal regions, and pelvic cul-de-sac. Transvaginal technique was performed to assess early pregnancy. COMPARISON:  None. FINDINGS: Intrauterine gestational sac: Single. Yolk sac:  Not visualized. Embryo:  Not visualized. Cardiac Activity: N/A Heart Rate: N/A bpm MSD: 15.9 mm   6 w   3 d Subchorionic hemorrhage:  None visualized. Maternal uterus/adnexae: Right ovary not visualized. Left ovary normal in appearance. Trace free physiologic fluid within the pelvis. IMPRESSION: 1. Probable early intrauterine gestational sac, but no yolk sac, fetal pole, or cardiac activity yet visualized. Recommend follow-up quantitative B-HCG levels and follow-up US in 14 days to  assess viability. This recommendation follows SRU consensus guidelines: Diagnostic Criteria for Nonviable Pregnancy Early in the First Trimester. Malva Limes Engl J Med 2013; 409:8119-14; 369:1443-51. 2. No other acute maternal uterine or adnexal abnormality identified. Electronically Signed   By: Rise MuBenjamin  McClintock M.D.   On: 09/14/2017 18:11    Procedures Procedures (including critical care time)  Medications Ordered in ED Medications  sodium chloride 0.9 % bolus 1,000 mL (1,000 mLs Intravenous New Bag/Given 09/14/17 1639)     Initial Impression / Assessment and Plan / ED Course  I have reviewed the triage vital signs and the nursing notes.  Pertinent labs & imaging results that were available during my care of the patient  were reviewed by me and considered in my medical decision making (see chart for details).     22 year old female presents for 1 month history of intermittent nausea, vomiting.  She also reports lower abdominal cramping.  Has not had a menstrual period in almost 2 months.  Concerned that she may be pregnant.  She does complain of a headache but states that it is intermittent.  She is not concerned about the headache.  She had no deficits on neurological exam.  She is overall well on physical exam.  Some suprapubic tenderness to palpation noted.  Denies any vaginal bleeding or vaginal discharge.  Lab work significant for hCG levels greater than 2000.  Lipase, CMP, CBC, urinalysis unremarkable.  Ultrasound shows probable single IUP with no fetal pole or yolk sac noted.  They do recommend repeat ultrasound and blood work in 14 days.  Advised patient to follow-up at the women's clinic in 14 days but to return sooner to the ED for any severe worsening symptoms.    Portions of this note were generated with Scientist, clinical (histocompatibility and immunogenetics). Dictation errors may occur despite best attempts at proofreading.   Final Clinical Impressions(s) / ED Diagnoses   Final diagnoses:  Pregnancy, unspecified gestational  age    ED Discharge Orders        Ordered    Prenatal Vit-Fe Fumarate-FA (PRENATAL COMPLETE) 14-0.4 MG TABS  Daily     09/14/17 1944       Dietrich Pates, PA-C 09/14/17 1947    Benjiman Core, MD 09/14/17 [phone]

## 2017-09-14 NOTE — ED Triage Notes (Signed)
patient c/o headache and vomiting x 1 month.

## 2017-09-14 NOTE — Discharge Instructions (Signed)
[facility] in 14 days for repeat ultrasound and blood work. Take prenatal vitamins as directed. You can take vitamin B6 and doxylamine for improvement in your nausea. Return to ED for worsening symptoms, severe abdominal pain, severe vaginal bleeding, lightheadedness or loss of consciousness.

## 2017-09-28 ENCOUNTER — Ambulatory Visit (INDEPENDENT_AMBULATORY_CARE_PROVIDER_SITE_OTHER): Payer: Self-pay | Admitting: General Practice

## 2017-09-28 DIAGNOSIS — O3680X0 Pregnancy with inconclusive fetal viability, not applicable or unspecified: Secondary | ICD-10-CM

## 2017-09-28 DIAGNOSIS — O283 Abnormal ultrasonic finding on antenatal screening of mother: Secondary | ICD-10-CM

## 2017-09-28 LAB — HCG, QUANTITATIVE, PREGNANCY: HCG, BETA CHAIN, QUANT, S: 38585 m[IU]/mL — AB (ref <5)

## 2017-09-28 NOTE — Progress Notes (Signed)
Patient presents to office today for stat bhcg. Patient denies pain or bleeding. Discussed with patient we are monitoring her bhcg levels today & asked she wait in lobby for results/updated plan of care. Patient verbalized understanding. Discussed patient's history with Dr Adrian BlackwaterStinson who advises in office ultrasound today.  Ultrasound shows gestational sac but unable to visualize yolk sac or fetal pole. Scheduled transvaginal ultrasound for tomorrow 7/16 @ 830am. Reviewed bhcg results with Dr Adrian BlackwaterStinson who finds increase in bhcg levels, but need ultrasound for additional information. Informed patient of results/updated plan of care. Patient will return to office for ultrasound results tomorrow. Patient verbalized understanding to all & had no questions at this time.

## 2017-09-29 ENCOUNTER — Ambulatory Visit (HOSPITAL_COMMUNITY)
Admission: RE | Admit: 2017-09-29 | Discharge: 2017-09-29 | Disposition: A | Discharge status: Home / Self Care | Payer: Self-pay | Source: Ambulatory Visit | Attending: Family Medicine | Admitting: Family Medicine

## 2017-09-29 ENCOUNTER — Ambulatory Visit (INDEPENDENT_AMBULATORY_CARE_PROVIDER_SITE_OTHER): Payer: Self-pay | Admitting: Obstetrics & Gynecology

## 2017-09-29 VITALS — BP 126/98 | HR 104 | Ht 62.5 in | Wt 191.0 lb

## 2017-09-29 DIAGNOSIS — O3680X0 Pregnancy with inconclusive fetal viability, not applicable or unspecified: Secondary | ICD-10-CM

## 2017-09-29 DIAGNOSIS — O020 Blighted ovum and nonhydatidiform mole: Secondary | ICD-10-CM

## 2017-09-29 DIAGNOSIS — O283 Abnormal ultrasonic finding on antenatal screening of mother: Secondary | ICD-10-CM | POA: Insufficient documentation

## 2017-09-29 MED ORDER — MISOPROSTOL 200 MCG PO TABS: ORAL_TABLET | ORAL | 0 refills | Status: DC

## 2017-09-29 NOTE — Progress Notes (Signed)
History:  22 y.o. G1P0 here today for LMP 07/14/2017  Pt reports that she is here for US results. She has been getting viability scans. She denies bleeding.    The following portions of the patient's history were reviewed and updated as appropriate: allergies, current medications, past family history, past medical history, past social history, past surgical history and problem list.  Review of Systems:  Pertinent items are noted in HPI.    Objective:  Physical Exam There were no vitals taken for this visit. BP (!) 126/98   Pulse (!) 104   Ht 5' 2.5" (1.588 m)   Wt 191 lb (86.6 kg)   BMI 34.38 kg/m   CONSTITUTIONAL: Well-developed, well-nourished female in no acute distress.  HENT:  Normocephalic, atraumatic EYES: Conjunctivae and EOM are normal. No scleral icterus.  NECK: Normal range of motion SKIN: Skin is warm and dry. No rash noted. Not diaphoretic.No pallor. NEUROLGIC: Alert and oriented to person, place, and time. Normal coordination.   Labs and Imaging [place]as Ob Comp < 14 Wks  Result Date: 09/14/2017 CLINICAL DATA:  Initial evaluation for acute pelvic pain for several days. Early pregnancy. EXAM: OBSTETRIC <14 WK US AND TRANSVAGINAL OB US TECHNIQUE: Both transabdominal and transvaginal ultrasound examinations were performed for complete evaluation of the gestation as well as the maternal uterus, adnexal regions, and pelvic cul-de-sac. Transvaginal technique was performed to assess early pregnancy. COMPARISON:  None. FINDINGS: Intrauterine gestational sac: Single. Yolk sac:  Not visualized. Embryo:  Not visualized. Cardiac Activity: N/A Heart Rate: N/A bpm MSD: 15.9 mm   6 w   3 d Subchorionic hemorrhage:  None visualized. Maternal uterus/adnexae: Right ovary not visualized. Left ovary normal in appearance. Trace free physiologic fluid within the pelvis. IMPRESSION: 1. Probable early intrauterine gestational sac, but no yolk sac, fetal pole, or cardiac activity yet visualized. Recommend  follow-up quantitative B-HCG levels and follow-up US in 14 days to assess viability. This recommendation follows SRU consensus guidelines: Diagnostic Criteria for Nonviable Pregnancy Early in the First Trimester. Malva Limes Engl J Med 2013; 284:1324-40; 369:1443-51. 2. No other acute maternal uterine or adnexal abnormality identified. Electronically Signed   By: Rise MuBenjamin  McClintock M.D.   On: 09/14/2017 18:11   [place] Ob Transvaginal  Result Date: 09/29/2017 CLINICAL DATA:  Confirm viability and location EXAM: TRANSVAGINAL OB ULTRASOUND TECHNIQUE: Transvaginal ultrasound was performed for complete evaluation of the gestation as well as the maternal uterus, adnexal regions, and pelvic cul-de-sac. COMPARISON:  09/14/2017 FINDINGS: Intrauterine gestational sac: Single Yolk sac:  Not visualized Embryo:  Not visualized Cardiac Activity: Not visualized Heart Rate:  bpm MSD: 24.3 mm   7 w   3 d CRL:    mm    w  d                  US EDC: Subchorionic hemorrhage:  Moderate subchorionic hemorrhage Maternal uterus/adnexae: No adnexal mass.  Trace free fluid. IMPRESSION: Intrauterine gestational sac remains present but no yolk sac or fetal pole. Given the size of the gestational sac and the time lapse from last study (15 days) without development of yolk sac or fetal pole, findings meet definitive criteria for failed pregnancy. This follows SRU consensus guidelines: Diagnostic Criteria for Nonviable Pregnancy Early in the First Trimester. Macy Mis Engl J Med [id number];369:1443-51. Electronically Signed   By: Charlett NoseKevin  Dover M.D.   On: 09/29/2017 09:12   [place] Ob Transvaginal  Result Date: 09/14/2017 CLINICAL DATA:  Initial evaluation for acute pelvic pain for several days.  Early pregnancy. EXAM: OBSTETRIC <14 WK [place] AND TRANSVAGINAL OB US TECHNIQUE: Both transabdominal and transvaginal ultrasound examinations were performed for complete evaluation of the gestation as well as the maternal uterus, adnexal regions, and pelvic cul-de-sac. Transvaginal technique was  performed to assess early pregnancy. COMPARISON:  None. FINDINGS: Intrauterine gestational sac: Single. Yolk sac:  Not visualized. Embryo:  Not visualized. Cardiac Activity: N/A Heart Rate: N/A bpm MSD: 15.9 mm   6 w   3 d Subchorionic hemorrhage:  None visualized. Maternal uterus/adnexae: Right ovary not visualized. Left ovary normal in appearance. Trace free physiologic fluid within the pelvis. IMPRESSION: 1. Probable early intrauterine gestational sac, but no yolk sac, fetal pole, or cardiac activity yet visualized. Recommend follow-up quantitative B-HCG levels and follow-up US in 14 days to assess viability. This recommendation follows SRU consensus guidelines: Diagnostic Criteria for Nonviable Pregnancy Early in the First Trimester. Malva Limes Med 2013; 098:1191-47. 2. No other acute maternal uterine or adnexal abnormality identified. Electronically Signed   By: Rise Mu M.D.   On: 09/14/2017 18:11    Assessment & Plan:  Blighted ovum-  I reviewed with pt conservative vs operative management. I have reviewed with ehr he risks of each option including he risk of failure or need for a repeat procedure.  Pt reports that she does not want surgery. She opts for conservative management with cytotec.   cytotec pv or rectally. Repeat in 10-12 hours if no passage of tissue F/u in 2 weeks or sooner prn I have advised pt when to f/u at the hosp All questions of pt and pts mother answered.   Total face-to-face time with patient was 20 min.  Greater than 50% was spent in counseling and coordination of care with the patient.   Jasma Seevers L. Harraway-Smith, M.D., Evern Core

## 2017-09-29 NOTE — Patient Instructions (Signed)
Blighted Ovum  A blighted ovum is a common kind of early pregnancy failure. It happens when a fertilized egg attaches to the uterus but stops growing. Even though the egg never develops, the body acts like it is pregnant. A sac starts to form around the egg, and tissue to support a baby starts to form in the placenta.  What are the causes?  This condition is usually caused by a genetic defect in the egg.  What are the signs or symptoms?  Early symptoms of this condition are the same as those of early pregnancy. They include:   A missed menstrual period.   Fatigue.   Feeling sick to your stomach (nauseous).   Sore breasts.    Later symptoms are those of pregnancy loss. They include:   Abdominal cramps.   Vaginal bleeding or spotting.   A menstrual period that is heavier than usual.    How is this diagnosed?  This condition is usually diagnosed during a routine ultrasound. It can be confirmed with blood tests.  How is this treated?  This condition may be treated by:   Waiting until your body naturally gets rid of the empty egg sac and placenta (miscarriage).   Taking medicine to start a miscarriage. This medicine can be taken by mouth or placed into the vagina.   Having a surgical procedure to remove the tissue. Your health care provider would open the entrance to your womb (dilation) and remove the tissue (curettage).    Follow these instructions at home:   Take over-the-counter and prescription medicines only as told by your health care provider.   Talk to your health care provider about when you can try to get pregnant again. Having this condition does not mean you will lose future pregnancies.   After your miscarriage:  ? Rest at home for a few days.  ? You may bleed heavily for a week or more, and you may have light bleeding for a couple weeks after that. Wear a pad until vaginal bleeding stops.  Contact a health care provider if:   You have a fever or chills.   Your pain medicine is not  helping.   You have vaginal bleeding that continues for longer than expected.  Get help right away if:   You have severe abdominal pain.   You feel dizzy or faint.   You pass out.   You have very heavy vaginal bleeding. A sign that vaginal bleeding is very heavy is if blood soaks through two large sanitary pads an hour for more than two hours.  This information is not intended to replace advice given to you by your health care provider. Make sure you discuss any questions you have with your health care provider.  Document Released: 06/18/2010 Document Revised: 08/09/2015 Document Reviewed: 07/19/2014  Elsevier Interactive Patient Education  2018 [employer] Inc[employer]

## 2017-10-01 ENCOUNTER — Encounter: Payer: Self-pay | Admitting: Obstetrics & Gynecology

## 2017-10-02 ENCOUNTER — Other Ambulatory Visit: Payer: Self-pay

## 2017-10-02 ENCOUNTER — Inpatient Hospital Stay (HOSPITAL_COMMUNITY)
Admission: AD | Admit: 2017-10-02 | Discharge: 2017-10-02 | Disposition: A | Discharge status: Home / Self Care | Payer: Self-pay | Source: Ambulatory Visit | Attending: Obstetrics & Gynecology | Admitting: Obstetrics & Gynecology

## 2017-10-02 ENCOUNTER — Encounter (HOSPITAL_COMMUNITY): Payer: Self-pay | Admitting: *Deleted

## 2017-10-02 DIAGNOSIS — Z87891 Personal history of nicotine dependence: Secondary | ICD-10-CM | POA: Insufficient documentation

## 2017-10-02 DIAGNOSIS — O039 Complete or unspecified spontaneous abortion without complication: Secondary | ICD-10-CM | POA: Insufficient documentation

## 2017-10-02 HISTORY — DX: Anxiety disorder, unspecified: F41.9

## 2017-10-02 HISTORY — DX: Unspecified infectious disease: B99.9

## 2017-10-02 LAB — ABO/RH: ABO/RH(D): O POS

## 2017-10-02 MED ORDER — PROMETHAZINE HCL 25 MG/ML IJ SOLN
12.5000 mg | Freq: Once | INTRAMUSCULAR | Status: AC
  Administered 2017-10-02: 12.5 mg via INTRAMUSCULAR

## 2017-10-02 MED ORDER — PROMETHAZINE HCL 25 MG/ML IJ SOLN
12.5000 mg | Freq: Once | INTRAMUSCULAR | Status: DC
  Filled 2017-10-02: qty 1

## 2017-10-02 MED ORDER — HYDROMORPHONE HCL 1 MG/ML IJ SOLN
1.0000 mg | Freq: Once | INTRAMUSCULAR | Status: AC
  Administered 2017-10-02: 1 mg via INTRAMUSCULAR
  Filled 2017-10-02: qty 1

## 2017-10-02 NOTE — Discharge Instructions (Signed)

## 2017-10-02 NOTE — MAU Note (Addendum)
Tues she took the medicine for the miscarriage.  Has been bleeding, some clots, the pain has gotten worse.  Cramp isn't coming and going, is "consistent, just a really long cramp that won't end" .  Less bleeding today.  Tylenol and Ibuprofen for pain, was not given anything for pain management

## 2017-10-02 NOTE — MAU Provider Note (Signed)
Chief Complaint: Abdominal Pain and Vaginal Bleeding   First Provider Initiated Contact with Patient 10/02/17 1301     SUBJECTIVE HPI: Cheryl EpleySarah D Henry is a 22 y.o. G2P0010 at Unknown who presents to Maternity Admissions reporting abdominal pain and vaginal bleeding. Was diagnosed with blighted ovum on 7/16 & was prescribed cytotec. States she took cytotec on the 16th. Had some bleeding with clots that day but unsure if she passed tissue. Bleeding has continued but not saturating pads. Pain significantly increased this morning. Took 400 mg of ibuprofen without relief. Denies fever/chills.   Location: lower abdomen Quality: sharp & cramp like Severity: 9/10 on pain scale Duration: this morning Timing: constant Modifying factors: not improved with ibuprofen. Nothing makes worse.  Associated signs and symptoms: vaginal bleeding  Past Medical History:  Diagnosis Date  . Anxiety   . Asthma    as child   . Depression    doing ok  . Infection    UTI   OB History  Gravida Para Term Preterm AB Living  2 0 0 0 1 0  SAB TAB Ectopic Multiple Live Births  1 0 0 0 0    # Outcome Date GA Lbr Len/2nd Weight Sex Delivery Anes PTL Lv  2 Current           1 SAB 2013           Past Surgical History:  Procedure Laterality Date  . MYRINGOTOMY WITH TUBE PLACEMENT    . TONSILLECTOMY     Social History   Socioeconomic History  . Marital status: Single    Spouse name: Not on file  . Number of children: Not on file  . Years of education: Not on file  . Highest education level: Not on file  Occupational History  . Not on file  Social Needs  . Financial resource strain: Not on file  . Food insecurity:    Worry: Not on file    Inability: Not on file  . Transportation needs:    Medical: Not on file    Non-medical: Not on file  Tobacco Use  . Smoking status: Former Smoker    Types: Cigarettes  . Smokeless tobacco: Never Used  . Tobacco comment: in HS  Substance and Sexual Activity  .  Alcohol use: Yes    Comment: occ  . Drug use: No  . Sexual activity: Yes    Partners: Male    Birth control/protection: Condom  Lifestyle  . Physical activity:    Days per week: Not on file    Minutes per session: Not on file  . Stress: Not on file  Relationships  . Social connections:    Talks on phone: Not on file    Gets together: Not on file    Attends religious service: Not on file    Active member of club or organization: Not on file    Attends meetings of clubs or organizations: Not on file    Relationship status: Not on file  . Intimate partner violence:    Fear of current or ex partner: Not on file    Emotionally abused: Not on file    Physically abused: Not on file    Forced sexual activity: Not on file  Other Topics Concern  . Not on file  Social History Narrative   Pt states that the only thing that she smokes is hookah occasionally.   Family History  Problem Relation Age of Onset  . Bipolar disorder Sister   .  Heart disease Maternal Grandmother        chf  . Diabetes Maternal Grandmother   . Heart disease Maternal Grandfather    No current facility-administered medications on file prior to encounter.    Current Outpatient Medications on File Prior to Encounter  Medication Sig Dispense Refill  . clindamycin (CLEOCIN) 300 MG capsule Take 1 capsule (300 mg total) by mouth 3 (three) times daily. (Patient not taking: Reported on 09/14/2017) 21 capsule 0  . Cyanocobalamin (VITAMIN B12 PO) Take 1 tablet by mouth 2 (two) times a week.    . cyclobenzaprine (FLEXERIL) 10 MG tablet Take 1 tablet (10 mg total) by mouth 3 (three) times daily as needed for muscle spasms. (Patient not taking: Reported on 09/14/2017) 12 tablet 0  . ibuprofen (ADVIL,MOTRIN) 200 MG tablet Take 800-1,000 mg by mouth every 6 (six) hours as needed (tooth pain).    Marland Kitchen ibuprofen (ADVIL,MOTRIN) 600 MG tablet Take 1 tablet (600 mg total) by mouth every 8 (eight) hours as needed. (Patient not taking:  Reported on 09/14/2017) 15 tablet 0  . lidocaine (XYLOCAINE) 2 % solution Use as directed 20 mLs in the mouth or throat as needed for mouth pain. (Patient not taking: Reported on 09/14/2017) 100 mL 0  . misoprostol (CYTOTEC) 200 MCG tablet Place four tablets in vaginally or rectum. Repeat in 8-12 hours if no passage of tissue. 8 tablet 0  . omeprazole (PRILOSEC) 20 MG capsule Take 1 capsule (20 mg total) by mouth daily. (Patient not taking: Reported on 09/14/2017) 14 capsule 0  . ondansetron (ZOFRAN ODT) 4 MG disintegrating tablet Take 1 tablet (4 mg total) by mouth every 8 (eight) hours as needed for nausea or vomiting. (Patient not taking: Reported on 09/14/2017) 6 tablet 0  . Prenatal Vit-Fe Fumarate-FA (PRENATAL COMPLETE) 14-0.4 MG TABS Take 1 tablet by mouth daily. 60 each 0  . sucralfate (CARAFATE) 1 g tablet Take 1 tablet (1 g total) by mouth 4 (four) times daily -  with meals and at bedtime. (Patient not taking: Reported on 09/14/2017) 60 tablet 0  . traMADol (ULTRAM) 50 MG tablet Take 1 tablet (50 mg total) by mouth every 6 (six) hours as needed. (Patient not taking: Reported on 09/14/2017) 10 tablet 0   Allergies  Allergen Reactions  . Prednisone Rash    I have reviewed patient's Past Medical Hx, Surgical Hx, Family Hx, Social Hx, medications and allergies.   Review of Systems  Constitutional: Negative.   Gastrointestinal: Positive for abdominal pain.  Genitourinary: Positive for vaginal bleeding.  Neurological: Negative for dizziness and headaches.    OBJECTIVE Patient Vitals for the past 24 hrs:  BP Temp Temp src Pulse Resp SpO2 Weight  10/02/17 1442 122/79 - - (!) 101 16 - -  10/02/17 1240 (!) 131/95 (!) 97.5 F (36.4 C) Oral 100 20 98 % 191 lb (86.6 kg)   Constitutional: Well-developed, well-nourished female. Visibly distressed.  Cardiovascular: normal rate Respiratory: normal rate and effort.  GI: Abd soft, non-tender, gravid appropriate for gestational age. Pos BS x 4 MS:  Extremities nontender, no edema, normal ROM Neurologic: Alert and oriented x 4.  GU:  SPECULUM EXAM: NEFG, tissue removed from cervix. Minimal bleeding after removal    LAB RESULTS Results for orders placed or performed during the hospital encounter of 10/02/17 (from the past 24 hour(s))  ABO/Rh     Status: None (Preliminary result)   Collection Time: 10/02/17  2:38 PM  Result Value Ref Range   ABO/RH(D)  O POS Performed at [facility], [address], [place], [place] [address]     IMAGING   MAU COURSE Orders Placed This Encounter  Procedures  . ABO/Rh  . Discharge patient   Meds ordered this encounter  Medications  . HYDROmorphone (DILAUDID) injection 1 mg  . DISCONTD: promethazine (PHENERGAN) injection 12.5 mg  . promethazine (PHENERGAN) injection 12.5 mg    MDM IM dilaudid & phenergan given to pt prior to exam being performed Minimal bleeding and resolution of pain with removal of POCs from cervix VSS Blood type not on file, will draw abo/rh prior to discharge O positive  ASSESSMENT 1. Miscarriage     PLAN Discharge home in stable condition. bleeding precautions pelvic rest Keep miscarriage follow up    Allergies as of 10/02/2017      Reactions   Prednisone Rash      Medication List    STOP taking these medications   clindamycin 300 MG capsule Commonly known as:  CLEOCIN   cyclobenzaprine 10 MG tablet Commonly known as:  FLEXERIL   lidocaine 2 % solution Commonly known as:  XYLOCAINE   misoprostol 200 MCG tablet Commonly known as:  CYTOTEC   omeprazole 20 MG capsule Commonly known as:  PRILOSEC   sucralfate 1 g tablet Commonly known as:  CARAFATE   traMADol 50 MG tablet Commonly known as:  ULTRAM     TAKE these medications   ibuprofen 200 MG tablet Commonly known as:  ADVIL,MOTRIN Take 800-1,000 mg by mouth every 6 (six) hours as needed (tooth pain). What changed:  Another medication with the same name was removed.  Continue taking this medication, and follow the directions you see here.   ondansetron 4 MG disintegrating tablet Commonly known as:  ZOFRAN ODT Take 1 tablet (4 mg total) by mouth every 8 (eight) hours as needed for nausea or vomiting.   PRENATAL COMPLETE 14-0.4 MG Tabs Take 1 tablet by mouth daily.   VITAMIN B12 PO Take 1 tablet by mouth 2 (two) times a week.        Judeth Horn, NP 10/02/2017  3:12 PM

## 2017-10-14 ENCOUNTER — Ambulatory Visit: Payer: Self-pay | Admitting: Obstetrics & Gynecology

## 2017-10-19 ENCOUNTER — Encounter: Payer: Self-pay | Admitting: General Practice

## 2017-10-19 NOTE — Progress Notes (Unsigned)
Patient no showed to appt on 7/31- per Dr Erin FullingHarraway Smith, patient can reschedule on her own accord.

## 2017-11-24 ENCOUNTER — Emergency Department (HOSPITAL_COMMUNITY): Payer: Medicaid Other

## 2017-11-24 ENCOUNTER — Emergency Department (HOSPITAL_COMMUNITY)
Admission: EM | Admit: 2017-11-24 | Discharge: 2017-11-24 | Disposition: A | Discharge status: Home / Self Care | Payer: Medicaid Other | Attending: Emergency Medicine | Admitting: Emergency Medicine

## 2017-11-24 ENCOUNTER — Encounter (HOSPITAL_COMMUNITY): Payer: Self-pay | Admitting: *Deleted

## 2017-11-24 ENCOUNTER — Inpatient Hospital Stay (HOSPITAL_COMMUNITY)
Admission: AD | Admit: 2017-11-24 | Discharge: 2017-11-24 | Disposition: A | Discharge status: Home / Self Care | Payer: Medicaid Other | Source: Ambulatory Visit | Attending: Obstetrics & Gynecology | Admitting: Obstetrics & Gynecology

## 2017-11-24 DIAGNOSIS — J45909 Unspecified asthma, uncomplicated: Secondary | ICD-10-CM | POA: Insufficient documentation

## 2017-11-24 DIAGNOSIS — O9989 Other specified diseases and conditions complicating pregnancy, childbirth and the puerperium: Secondary | ICD-10-CM | POA: Insufficient documentation

## 2017-11-24 DIAGNOSIS — Z87891 Personal history of nicotine dependence: Secondary | ICD-10-CM | POA: Insufficient documentation

## 2017-11-24 DIAGNOSIS — N912 Amenorrhea, unspecified: Secondary | ICD-10-CM

## 2017-11-24 DIAGNOSIS — R102 Pelvic and perineal pain: Secondary | ICD-10-CM | POA: Insufficient documentation

## 2017-11-24 DIAGNOSIS — Z3A01 Less than 8 weeks gestation of pregnancy: Secondary | ICD-10-CM | POA: Insufficient documentation

## 2017-11-24 DIAGNOSIS — Z79899 Other long term (current) drug therapy: Secondary | ICD-10-CM | POA: Insufficient documentation

## 2017-11-24 DIAGNOSIS — R1084 Generalized abdominal pain: Secondary | ICD-10-CM | POA: Diagnosis not present

## 2017-11-24 DIAGNOSIS — O99511 Diseases of the respiratory system complicating pregnancy, first trimester: Secondary | ICD-10-CM | POA: Insufficient documentation

## 2017-11-24 LAB — COMPREHENSIVE METABOLIC PANEL
ALK PHOS: 55 U/L (ref 38–126)
ALT: 20 U/L (ref 0–44)
AST: 17 U/L (ref 15–41)
Albumin: 4.1 g/dL (ref 3.5–5.0)
Anion gap: 8 (ref 5–15)
BUN: 10 mg/dL (ref 6–20)
CALCIUM: 9.2 mg/dL (ref 8.9–10.3)
CO2: 26 mmol/L (ref 22–32)
CREATININE: 0.78 mg/dL (ref 0.44–1.00)
Chloride: 110 mmol/L (ref 98–111)
GFR calc Af Amer: >60 mL/min (ref >60)
GFR calc non Af Amer: >60 mL/min (ref >60)
Glucose, Bld: 75 mg/dL (ref 70–99)
Potassium: 3.6 mmol/L (ref 3.5–5.1)
Sodium: 144 mmol/L (ref 135–145)
Total Bilirubin: 0.5 mg/dL (ref 0.3–1.2)
Total Protein: 7.1 g/dL (ref 6.5–8.1)

## 2017-11-24 LAB — LIPASE, BLOOD: Lipase: 27 U/L (ref 11–51)

## 2017-11-24 LAB — CBC
HCT: 44.1 % (ref 36.0–46.0)
Hemoglobin: 14.7 g/dL (ref 12.0–15.0)
MCH: 31.3 pg (ref 26.0–34.0)
MCHC: 33.3 g/dL (ref 30.0–36.0)
MCV: 93.8 fL (ref 78.0–100.0)
Platelets: 276 10*3/uL (ref 150–400)
RBC: 4.7 MIL/uL (ref 3.87–5.11)
RDW: 12.9 % (ref 11.5–15.5)
WBC: 6.1 10*3/uL (ref 4.0–10.5)

## 2017-11-24 LAB — URINALYSIS, ROUTINE W REFLEX MICROSCOPIC
Bilirubin Urine: NEGATIVE
GLUCOSE, UA: NEGATIVE mg/dL
Hgb urine dipstick: NEGATIVE
Ketones, ur: NEGATIVE mg/dL
LEUKOCYTES UA: NEGATIVE
Nitrite: NEGATIVE
PROTEIN: NEGATIVE mg/dL
Specific Gravity, Urine: 1.01 (ref 1.005–1.030)
pH: 6 (ref 5.0–8.0)

## 2017-11-24 LAB — HCG, QUANTITATIVE, PREGNANCY: hCG, Beta Chain, Quant, S: 25935 m[IU]/mL — ABNORMAL HIGH (ref <5)

## 2017-11-24 LAB — I-STAT BETA HCG BLOOD, ED (MC, WL, AP ONLY): I-stat hCG, quantitative: >2000 m[IU]/mL — ABNORMAL HIGH (ref <5)

## 2017-11-24 MED ORDER — PRENATAL COMPLETE 14-0.4 MG PO TABS: 1.0000 | ORAL_TABLET | Freq: Every day | ORAL | 0 refills | Status: DC

## 2017-11-24 NOTE — MAU Provider Note (Signed)
Ms. YA RIZZOTTI is a 22 y.o. G2P0010 who present to MAU today for pregnancy confirmation. She denies abdominal pain or vaginal bleeding.   BP 116/88 (BP Location: Right Arm)   Pulse (!) 117   Temp 97.9 F (36.6 C) (Oral)   Resp 18   Ht 5\' 2"  (1.575 m)   Wt 88.9 kg   Breastfeeding? Unknown Comment: Hasn't had a period since miscarriage in July  BMI 35.85 kg/m  CONSTITUTIONAL: Well-developed, well-nourished female in no acute distress.  CARDIOVASCULAR: Normal heart rate noted RESPIRATORY: Effort and breath sounds normal GASTROINTESTINAL:Soft, no distention noted.  No tenderness, rebound or guarding.  SKIN: Skin is warm and dry. No rash noted. Not diaphoretic. No erythema. No pallor. PSYCHIATRIC: Normal mood and affect. Normal behavior. Normal judgment and thought content.  MDM Medical screening exam complete Patient does not endorse any symptoms concerning for ectopic pregnancy or pregnancy related complication today.   A:  Amenorrhea  P: Discharge home Patient advised that she can present as a walk-in to [facility] for a pregnancy test M-Th between 8am-4pm or Friday between 8am -11am Reasons to return to MAU reviewed  Patient may return to MAU as needed or if her condition were to change or worsen  Calvert Cantor, [place] 11/24/2017 4:22 PM

## 2017-11-24 NOTE — ED Triage Notes (Signed)
Pt c/o abd cramping, headache, nausea for week. Reports had miscarriage in July. Reports white vaginal discharge without odor.

## 2017-11-24 NOTE — MAU Note (Signed)
Pt had miscarriage on July 17, was seen a week later for pain from cytotec.  Has been feeling sick for the past two weeks, three pos HPT's two nights ago.  Denies pain or bleeding.  Hasn't had a period since her miscarriage.

## 2017-11-24 NOTE — ED Notes (Signed)
Not able to get urine.   Pt is having ultrasound.

## 2017-11-24 NOTE — ED Provider Notes (Signed)
Cheryl Henry Provider Note   CSN: [id number] Arrival date & time: 11/24/17  1627   History   Chief Complaint Chief Complaint  Patient presents with  . Abdominal Cramping  . Headache  . Nausea    HPI Cheryl Henry is a 22 y.o. female who presents for evaluation of intermittent abdominal cramping x 2 days and a pregnancy test. States she had a miscarriage July 2019. Was given Methotrexate. Has not had a cycle since miscarriage. Took a home pregnancy test 3 weeks ago, which was negative. Has not had intercourse since the negative pregnancy test however was actively trying to get pregnant after the miscariage. States her cramping is rated a 2/10. Intermittent in nature and does not radiate. Worse with movement. Describes it as a "tight, fullness feeling." Last episode of cramping yesterday evening around 7pm. States she was seen at [facility] for a pregnancy test and states they told her she had to come back during certain hours for this. She presents today for a pregnancy test. Denies fever, chills, vomiting, chest pain, SOB, dysuria, diarrhea, constipation, vaginal discharge, vaginal bleeding, sick contacts, hematemasis, back pain. Admits to a headache yesterday and nausea which resolved with Tylenol and Ibuprofen. No hx of abdominal surgery.    Past Medical History:  Diagnosis Date  . Anxiety   . Asthma    as child   . Depression    doing ok  . Infection    UTI    Patient Active Problem List   Diagnosis Date Noted  . Missed period 10/31/2013  . Absence of menstruation 10/31/2013  . Dysmenorrhea 05/31/2013  . Vaginitis and vulvovaginitis, unspecified 05/31/2013  . Acetaminophen overdose 06/15/2012  . Phenytoin overdose 06/15/2012  . Mild intermittent asthma 06/15/2012    Past Surgical History:  Procedure Laterality Date  . MYRINGOTOMY WITH TUBE PLACEMENT    . TONSILLECTOMY       OB History    Gravida  2   Para  0   Term  0   Preterm  0   AB  1   Living  0     SAB  1   TAB  0   Ectopic  0   Multiple  0   Live Births  0            Home Medications    Prior to Admission medications   Medication Sig Start Date End Date Taking? Authorizing Provider  Cyanocobalamin (VITAMIN B12 PO) Take 1 tablet by mouth 2 (two) times a week.   Yes [provider]  ibuprofen (ADVIL,MOTRIN) 200 MG tablet Take 800-1,000 mg by mouth every 6 (six) hours as needed (tooth pain).   Yes [provider]  ondansetron (ZOFRAN ODT) 4 MG disintegrating tablet Take 1 tablet (4 mg total) by mouth every 8 (eight) hours as needed for nausea or vomiting. Patient not taking: Reported on 09/14/2017 07/25/17   Rise Mu, PA-C  Prenatal Vit-Fe Fumarate-FA (PRENATAL COMPLETE) 14-0.4 MG TABS Take 1 tablet by mouth daily. 11/24/17   Lizzet Hendley A, PA-C    Family History Family History  Problem Relation Age of Onset  . Bipolar disorder Sister   . Heart disease Maternal Grandmother        chf  . Diabetes Maternal Grandmother   . Heart disease Maternal Grandfather     Social History Social History   Tobacco Use  . Smoking status: Former Smoker    Types: Cigarettes  .  Smokeless tobacco: Never Used  . Tobacco comment: in HS  Substance Use Topics  . Alcohol use: Yes    Comment: occ  . Drug use: No     Allergies   Prednisone   Review of Systems Review of Systems  Review of systems negative unless otherwise stated in the HPI. Physical Exam Updated Vital Signs BP 116/87 (BP Location: Left Arm)   Pulse (!) 101   Temp 98.8 F (37.1 C) (Oral)   Resp 17   LMP 07/21/2017 Comment: bilghted ovum  SpO2 100%   Physical Exam  Constitutional: She appears well-developed and well-nourished. No distress.  HENT:  Head: Atraumatic.  Eyes: Pupils are equal, round, and reactive to light.  Neck: Normal range of motion. Neck supple.  Cardiovascular: Normal rate and regular rhythm.  Pulmonary/Chest:  Effort normal. No respiratory distress.  Abdominal: Soft. Normal appearance and bowel sounds are normal. She exhibits no distension, no pulsatile liver, no fluid wave, no ascites, no pulsatile midline mass and no mass. There is no hepatosplenomegaly. There is no tenderness. There is no rigidity, no rebound, no guarding, no CVA tenderness, no tenderness at McBurney's point and negative [place]'s sign.  Musculoskeletal: Normal range of motion.  Neurological: She is alert.  Skin: Skin is warm and dry. She is not diaphoretic.  Psychiatric: She has a normal mood and affect.  Nursing note and vitals reviewed.    ED Treatments / Results  Labs (all labs ordered are listed, but only abnormal results are displayed) Labs Reviewed  URINALYSIS, ROUTINE W REFLEX MICROSCOPIC - Abnormal; Notable for the following components:      Result Value   Color, Urine STRAW (*)    All other components within normal limits  HCG, QUANTITATIVE, PREGNANCY - Abnormal; Notable for the following components:   hCG, Beta Chain, Quant, S 25,935 (*)    All other components within normal limits  I-STAT BETA HCG BLOOD, ED ([facility], [facility], [facility] ONLY) - Abnormal; Notable for the following components:   I-stat hCG, quantitative >2,000.0 (*)    All other components within normal limits  LIPASE, BLOOD  COMPREHENSIVE METABOLIC PANEL  CBC    EKG None  Radiology US Ob Comp < 14 Wks  Result Date: 11/24/2017 CLINICAL DATA:  Abdominal and pelvic pain and cramping. Recent miscarriage approximately 2 months ago. EXAM: OBSTETRIC <14 WK [place] AND TRANSVAGINAL OB US TECHNIQUE: Both transabdominal and transvaginal ultrasound examinations were performed for complete evaluation of the gestation as well as the maternal uterus, adnexal regions, and pelvic cul-de-sac. Transvaginal technique was performed to assess early pregnancy. COMPARISON:  None. FINDINGS: Intrauterine gestational sac: Single Yolk sac:  Visualized. Embryo:  Visualized. Cardiac Activity:  Visualized. Heart Rate: 112 bpm CRL:  4 mm   6 w   1 d                  [place] EDC: 07/19/2018 Subchorionic hemorrhage:  None visualized. Maternal uterus/adnexae: Small right ovarian corpus luteum cyst noted. Normal appearance of left ovary. No mass or abnormal free fluid identified. IMPRESSION: Single living IUP measuring 6 weeks 1 day, with [place] EDC of 07/19/2018. No significant maternal uterine or adnexal abnormality identified. Electronically Signed   By: Myles Rosenthal M.D.   On: 11/24/2017 18:48   US Ob Transvaginal  Result Date: 11/24/2017 CLINICAL DATA:  Abdominal and pelvic pain and cramping. Recent miscarriage approximately 2 months ago. EXAM: OBSTETRIC <14 WK [place] AND TRANSVAGINAL OB US TECHNIQUE: Both transabdominal and transvaginal ultrasound examinations were  performed for complete evaluation of the gestation as well as the maternal uterus, adnexal regions, and pelvic cul-de-sac. Transvaginal technique was performed to assess early pregnancy. COMPARISON:  None. FINDINGS: Intrauterine gestational sac: Single Yolk sac:  Visualized. Embryo:  Visualized. Cardiac Activity: Visualized. Heart Rate: 112 bpm CRL:  4 mm   6 w   1 d                  [place] EDC: 07/19/2018 Subchorionic hemorrhage:  None visualized. Maternal uterus/adnexae: Small right ovarian corpus luteum cyst noted. Normal appearance of left ovary. No mass or abnormal free fluid identified. IMPRESSION: Single living IUP measuring 6 weeks 1 day, with [place] EDC of 07/19/2018. No significant maternal uterine or adnexal abnormality identified. Electronically Signed   By: Myles Rosenthal M.D.   On: 11/24/2017 18:48    Procedures Procedures (including critical care time)  Medications Ordered in ED Medications - No data to display   Initial Impression / Assessment and Plan / ED Course  I have reviewed the triage vital signs and the nursing notes as well as past medical history.  Pertinent labs & imaging results that were available during my care of the  patient were reviewed by me and considered in my medical decision making (see chart for details).  Patient presents for evaluation of intermittent abdominal cramping and a pregnancy test. Miscarriage July 2019. Seen at [facility] hospital today. Hcg [id number]. U/S with positive IUP at 6 weeks 1 day.  Benign abdomenal exam patient. States she does not want a pelvic exam and will follow-up with Obgyn tomorrow.  States she has had no vaginal bleeding or cramping resolved yesterday she does not feel it is needed.  Discussed with patient risks versus benefits.  Patient verbalizes understanding. Patient is stable for discharge home.  Will recommend OB/GYN follow-up for reevaluation of her pregnancy.  Recommended starting prenatal vitamin.  Patient and mother voiced understanding. Strict return precautions given.    Final Clinical Impressions(s) / ED Diagnoses   Final diagnoses:  Less than [redacted] weeks gestation of pregnancy    ED Discharge Orders         Ordered    Prenatal Vit-Fe Fumarate-FA (PRENATAL COMPLETE) 14-0.4 MG TABS  Daily     11/24/17 1942           Kenniel Bergsma A, PA-C 11/25/17 Judie Petit, MD 11/25/17 1430

## 2017-11-24 NOTE — Discharge Instructions (Addendum)
You were evaluated today for abdominal cramping and pregnancy test.  Your pregnancy test was positive.  Ultrasound showed an intrauterine pregnancy approximately 6 weeks 1 day. Please follow-up with your Obgyn for re-evalution. Return to the ED with any new or worsening symptoms such as : Vaginal bleeding Abdominal pain Fever Chills Persistent vomiting

## 2017-12-02 ENCOUNTER — Inpatient Hospital Stay (HOSPITAL_COMMUNITY)
Admission: AD | Admit: 2017-12-02 | Discharge: 2017-12-02 | Disposition: A | Discharge status: Home / Self Care | Payer: Medicaid Other | Source: Ambulatory Visit | Attending: Obstetrics and Gynecology | Admitting: Obstetrics and Gynecology

## 2017-12-02 ENCOUNTER — Encounter (HOSPITAL_COMMUNITY): Payer: Self-pay

## 2017-12-02 DIAGNOSIS — O26891 Other specified pregnancy related conditions, first trimester: Secondary | ICD-10-CM | POA: Insufficient documentation

## 2017-12-02 DIAGNOSIS — R109 Unspecified abdominal pain: Secondary | ICD-10-CM

## 2017-12-02 DIAGNOSIS — O3481 Maternal care for other abnormalities of pelvic organs, first trimester: Secondary | ICD-10-CM | POA: Insufficient documentation

## 2017-12-02 DIAGNOSIS — O209 Hemorrhage in early pregnancy, unspecified: Secondary | ICD-10-CM | POA: Diagnosis not present

## 2017-12-02 DIAGNOSIS — F329 Major depressive disorder, single episode, unspecified: Secondary | ICD-10-CM | POA: Diagnosis not present

## 2017-12-02 DIAGNOSIS — Z87891 Personal history of nicotine dependence: Secondary | ICD-10-CM | POA: Insufficient documentation

## 2017-12-02 DIAGNOSIS — Z3A01 Less than 8 weeks gestation of pregnancy: Secondary | ICD-10-CM | POA: Diagnosis not present

## 2017-12-02 DIAGNOSIS — O99341 Other mental disorders complicating pregnancy, first trimester: Secondary | ICD-10-CM | POA: Diagnosis not present

## 2017-12-02 DIAGNOSIS — O26899 Other specified pregnancy related conditions, unspecified trimester: Secondary | ICD-10-CM

## 2017-12-02 DIAGNOSIS — O09291 Supervision of pregnancy with other poor reproductive or obstetric history, first trimester: Secondary | ICD-10-CM | POA: Diagnosis not present

## 2017-12-02 DIAGNOSIS — F419 Anxiety disorder, unspecified: Secondary | ICD-10-CM | POA: Insufficient documentation

## 2017-12-02 DIAGNOSIS — N8311 Corpus luteum cyst of right ovary: Secondary | ICD-10-CM | POA: Insufficient documentation

## 2017-12-02 DIAGNOSIS — O99511 Diseases of the respiratory system complicating pregnancy, first trimester: Secondary | ICD-10-CM | POA: Insufficient documentation

## 2017-12-02 DIAGNOSIS — J45909 Unspecified asthma, uncomplicated: Secondary | ICD-10-CM | POA: Diagnosis not present

## 2017-12-02 DIAGNOSIS — R102 Pelvic and perineal pain: Secondary | ICD-10-CM | POA: Diagnosis not present

## 2017-12-02 LAB — URINALYSIS, MICROSCOPIC (REFLEX): WBC, UA: NONE SEEN WBC/hpf (ref 0–5)

## 2017-12-02 LAB — URINALYSIS, ROUTINE W REFLEX MICROSCOPIC

## 2017-12-02 NOTE — Discharge Instructions (Signed)
First Trimester of Pregnancy The first trimester of pregnancy is from week 1 until the end of week 13 (months 1 through 3). During this time, your baby will begin to develop inside you. At 6-8 weeks, the eyes and face are formed, and the heartbeat can be seen on ultrasound. At the end of 12 weeks, all the baby's organs are formed. Prenatal care is all the medical care you receive before the birth of your baby. Make sure you get good prenatal care and follow all of your doctor's instructions. Follow these instructions at home: Medicines  Take over-the-counter and prescription medicines only as told by your doctor. Some medicines are safe and some medicines are not safe during pregnancy.  Take a prenatal vitamin that contains at least 600 micrograms (mcg) of folic acid.  If you have trouble pooping (constipation), take medicine that will make your stool soft (stool softener) if your doctor approves. Eating and drinking  Eat regular, healthy meals.  Your doctor will tell you the amount of weight gain that is right for you.  Avoid raw meat and uncooked cheese.  If you feel sick to your stomach (nauseous) or throw up (vomit): ? Eat 4 or 5 small meals a day instead of 3 large meals. ? Try eating a few soda crackers. ? Drink liquids between meals instead of during meals.  To prevent constipation: ? Eat foods that are high in fiber, like fresh fruits and vegetables, whole grains, and beans. ? Drink enough fluids to keep your pee (urine) clear or pale yellow. Activity  Exercise only as told by your doctor. Stop exercising if you have cramps or pain in your lower belly (abdomen) or low back.  Do not exercise if it is too hot, too humid, or if you are in a place of great height (high altitude).  Try to avoid standing for long periods of time. Move your legs often if you must stand in one place for a long time.  Avoid heavy lifting.  Wear low-heeled shoes. Sit and stand up straight.  You  can have sex unless your doctor tells you not to. Relieving pain and discomfort  Wear a good support bra if your breasts are sore.  Take warm water baths (sitz baths) to soothe pain or discomfort caused by hemorrhoids. Use hemorrhoid cream if your doctor says it is okay.  Rest with your legs raised if you have leg cramps or low back pain.  If you have puffy, bulging veins (varicose veins) in your legs: ? Wear support hose or compression stockings as told by your doctor. ? Raise (elevate) your feet for 15 minutes, 3-4 times a day. ? Limit salt in your food. Prenatal care  Schedule your prenatal visits by the twelfth week of pregnancy.  Write down your questions. Take them to your prenatal visits.  Keep all your prenatal visits as told by your doctor. This is important. Safety  Wear your seat belt at all times when driving.  Make a list of emergency phone numbers. The list should include numbers for family, friends, the hospital, and police and fire departments. General instructions  Ask your doctor for a referral to a local prenatal class. Begin classes no later than at the start of month 6 of your pregnancy.  Ask for help if you need counseling or if you need help with nutrition. Your doctor can give you advice or tell you where to go for help.  Do not use hot tubs, steam rooms, or   saunas.  Do not douche or use tampons or scented sanitary pads.  Do not cross your legs for long periods of time.  Avoid all herbs and alcohol. Avoid drugs that are not approved by your doctor.  Do not use any tobacco products, including cigarettes, chewing tobacco, and electronic cigarettes. If you need help quitting, ask your doctor. You may get counseling or other support to help you quit.  Avoid cat litter boxes and soil used by cats. These carry germs that can cause birth defects in the baby and can cause a loss of your baby (miscarriage) or stillbirth.  Visit your dentist. At home, brush  your teeth with a soft toothbrush. Be gentle when you floss. Contact a doctor if:  You are dizzy.  You have mild cramps or pressure in your lower belly.  You have a nagging pain in your belly area.  You continue to feel sick to your stomach, you throw up, or you have watery poop (diarrhea).  You have a bad smelling fluid coming from your vagina.  You have pain when you pee (urinate).  You have increased puffiness (swelling) in your face, hands, legs, or ankles. Get help right away if:  You have a fever.  You are leaking fluid from your vagina.  You have spotting or bleeding from your vagina.  You have very bad belly cramping or pain.  You gain or lose weight rapidly.  You throw up blood. It may look like coffee grounds.  You are around people who have German measles, fifth disease, or chickenpox.  You have a very bad headache.  You have shortness of breath.  You have any kind of trauma, such as from a fall or a car accident. Summary  The first trimester of pregnancy is from week 1 until the end of week 13 (months 1 through 3).  To take care of yourself and your unborn baby, you will need to eat healthy meals, take medicines only if your doctor tells you to do so, and do activities that are safe for you and your baby.  Keep all follow-up visits as told by your doctor. This is important as your doctor will have to ensure that your baby is healthy and growing well. This information is not intended to replace advice given to you by your health care provider. Make sure you discuss any questions you have with your health care provider. Document Released: 08/20/2007 Document Revised: 03/11/2016 Document Reviewed: 03/11/2016 Elsevier Interactive Patient Education  2017 Elsevier Inc.  

## 2017-12-02 NOTE — MAU Note (Signed)
Pt states she is [redacted] weeks pregnant. States 2 hours ago she started having light cramping and bleeding. States the blood came out like a period. States she is not wearing a pad but has toilet paper. Had a miscarriage in July

## 2017-12-02 NOTE — MAU Provider Note (Signed)
Chief Complaint: Vaginal Bleeding and Abdominal Pain   First Provider Initiated Contact with Patient 12/02/17 2022        SUBJECTIVE HPI: Cheryl Henry is a 22 y.o. G3P0010 at [redacted]w[redacted]d by LMP who presents to maternity admissions reporting pelvic cramping and period like bleeding.  Saw one clot in toilet.  Does not have blood on clothing between times of voiding. . She denies vaginal bleeding, vaginal itching/burning, urinary symptoms, h/a, dizziness, n/v, or fever/chills.    Seen in ED for cramping on 11/24/17 US showed: Single living IUP measuring 6 weeks 1 day, with [place] EDC of 07/19/2018.  Vaginal Bleeding  The patient's primary symptoms include pelvic pain and vaginal bleeding. The patient's pertinent negatives include no genital itching, genital lesions or genital odor. This is a new problem. The current episode started today. The problem occurs rarely. The problem has been waxing and waning. The pain is mild. She is pregnant. Associated symptoms include abdominal pain. Pertinent negatives include no back pain, constipation, diarrhea, fever, frequency, headaches or nausea. The vaginal discharge was bloody. The vaginal bleeding is lighter than menses. She has been passing clots. She has not been passing tissue. Nothing aggravates the symptoms. She has tried nothing for the symptoms.  Abdominal Pain  This is a new problem. The current episode started today. The onset quality is gradual. The problem occurs intermittently. The problem has been unchanged. The quality of the pain is cramping. Pertinent negatives include no constipation, diarrhea, fever, frequency, headaches or nausea. Nothing aggravates the pain. The pain is relieved by nothing. She has tried nothing for the symptoms.   RN Note: Pt states she is [redacted] weeks pregnant. States 2 hours ago she started having light cramping and bleeding. States the blood came out like a period. States she is not wearing a pad but has toilet paper. Had a  miscarriage in July  Past Medical History:  Diagnosis Date  . Anxiety   . Asthma    as child   . Depression    doing ok  . Infection    UTI   Past Surgical History:  Procedure Laterality Date  . MYRINGOTOMY WITH TUBE PLACEMENT    . TONSILLECTOMY     Social History   Socioeconomic History  . Marital status: Single    Spouse name: Not on file  . Number of children: Not on file  . Years of education: Not on file  . Highest education level: Not on file  Occupational History  . Not on file  Social Needs  . Financial resource strain: Not on file  . Food insecurity:    Worry: Not on file    Inability: Not on file  . Transportation needs:    Medical: Not on file    Non-medical: Not on file  Tobacco Use  . Smoking status: Former Smoker    Types: Cigarettes  . Smokeless tobacco: Never Used  . Tobacco comment: in HS  Substance and Sexual Activity  . Alcohol use: Yes    Comment: occ  . Drug use: No  . Sexual activity: Yes    Partners: Male    Birth control/protection: Condom  Lifestyle  . Physical activity:    Days per week: Not on file    Minutes per session: Not on file  . Stress: Not on file  Relationships  . Social connections:    Talks on phone: Not on file    Gets together: Not on file    Attends religious  service: Not on file    Active member of club or organization: Not on file    Attends meetings of clubs or organizations: Not on file    Relationship status: Not on file  . Intimate partner violence:    Fear of current or ex partner: Not on file    Emotionally abused: Not on file    Physically abused: Not on file    Forced sexual activity: Not on file  Other Topics Concern  . Not on file  Social History Narrative   Pt states that the only thing that she smokes is hookah occasionally.   No current facility-administered medications on file prior to encounter.    Current Outpatient Medications on File Prior to Encounter  Medication Sig Dispense Refill   . Cyanocobalamin (VITAMIN B12 PO) Take 1 tablet by mouth 2 (two) times a week.    Marland Kitchen. ibuprofen (ADVIL,MOTRIN) 200 MG tablet Take 800-1,000 mg by mouth every 6 (six) hours as needed (tooth pain).    . ondansetron (ZOFRAN ODT) 4 MG disintegrating tablet Take 1 tablet (4 mg total) by mouth every 8 (eight) hours as needed for nausea or vomiting. (Patient not taking: Reported on 09/14/2017) 6 tablet 0  . Prenatal Vit-Fe Fumarate-FA (PRENATAL COMPLETE) 14-0.4 MG TABS Take 1 tablet by mouth daily. 60 each 0   Allergies  Allergen Reactions  . Prednisone Rash    I have reviewed patient's Past Medical Hx, Surgical Hx, Family Hx, Social Hx, medications and allergies.   ROS:  Review of Systems  Constitutional: Negative for fever.  Gastrointestinal: Positive for abdominal pain. Negative for constipation, diarrhea and nausea.  Genitourinary: Positive for pelvic pain and vaginal bleeding. Negative for frequency.  Musculoskeletal: Negative for back pain.  Neurological: Negative for headaches.   Review of Systems  Other systems negative   Physical Exam  Physical Exam Patient Vitals for the past 24 hrs:  BP Temp Temp src Pulse Resp SpO2 Height Weight  12/02/17 2011 119/80 98.5 F (36.9 C) Oral 94 16 100 % 5\' 2"  (1.575 m) 88.9 kg   Constitutional: Well-developed, well-nourished female in no acute distress.  Cardiovascular: normal rate Respiratory: normal effort GI: Abd soft, non-tender. Pos BS x 4 MS: Extremities nontender, no edema, normal ROM Neurologic: Alert and oriented x 4.  GU: Neg CVAT.  PELVIC EXAM: No bleeding currently   Pelvic exam deferred due to recent exam and live fetus seen on bedside US.    FHT 150s by bedside ultrasound  LAB RESULTS No results found for this or any previous visit (from the past 24 hour(s)).  --/--/O POS Performed at [facility], [address] [place], [place] [address]  [phone](07/19 1438)  IMAGING [place] Ob Comp < 14 Wks  Result Date:  11/24/2017 CLINICAL DATA:  Abdominal and pelvic pain and cramping. Recent miscarriage approximately 2 months ago. EXAM: OBSTETRIC <14 WK US AND TRANSVAGINAL OB US TECHNIQUE: Both transabdominal and transvaginal ultrasound examinations were performed for complete evaluation of the gestation as well as the maternal uterus, adnexal regions, and pelvic cul-de-sac. Transvaginal technique was performed to assess early pregnancy. COMPARISON:  None. FINDINGS: Intrauterine gestational sac: Single Yolk sac:  Visualized. Embryo:  Visualized. Cardiac Activity: Visualized. Heart Rate: 112 bpm CRL:  4 mm   6 w   1 d                  US EDC: 07/19/2018 Subchorionic hemorrhage:  None visualized. Maternal uterus/adnexae: Small right ovarian corpus luteum cyst  noted. Normal appearance of left ovary. No mass or abnormal free fluid identified. IMPRESSION: Single living IUP measuring 6 weeks 1 day, with [place] EDC of 07/19/2018. No significant maternal uterine or adnexal abnormality identified. Electronically Signed   By: Myles Rosenthal M.D.   On: 11/24/2017 18:48   US Ob Transvaginal  Result Date: 11/24/2017 CLINICAL DATA:  Abdominal and pelvic pain and cramping. Recent miscarriage approximately 2 months ago. EXAM: OBSTETRIC <14 WK [place] AND TRANSVAGINAL OB US TECHNIQUE: Both transabdominal and transvaginal ultrasound examinations were performed for complete evaluation of the gestation as well as the maternal uterus, adnexal regions, and pelvic cul-de-sac. Transvaginal technique was performed to assess early pregnancy. COMPARISON:  None. FINDINGS: Intrauterine gestational sac: Single Yolk sac:  Visualized. Embryo:  Visualized. Cardiac Activity: Visualized. Heart Rate: 112 bpm CRL:  4 mm   6 w   1 d                  [place] EDC: 07/19/2018 Subchorionic hemorrhage:  None visualized. Maternal uterus/adnexae: Small right ovarian corpus luteum cyst noted. Normal appearance of left ovary. No mass or abnormal free fluid identified. IMPRESSION: Single  living IUP measuring 6 weeks 1 day, with [place] EDC of 07/19/2018. No significant maternal uterine or adnexal abnormality identified. Electronically Signed   By: Myles Rosenthal M.D.   On: 11/24/2017 18:48    MAU Management/MDM: Reviewed notes from ED visit They did a comprehensive exam and labs US done there showed a live single IUP This was seen again today on bedside limited ultrasound  This bleeding/pain can represent a normal pregnancy with bleeding, spontaneous abortion or even an ectopic which can be life-threatening.  The process as listed above helps to determine which of these is present.    Pt informed that the ultrasound is considered a limited OB ultrasound and is not intended to be a complete ultrasound exam.  Patient also informed that the ultrasound is not being completed with the intent of assessing for fetal or placental anomalies or any pelvic abnormalities.   Explained that the purpose of today's ultrasound is to assess for  viability.  Patient acknowledges the purpose of the exam and the limitations of the study.     Study performed by me: variable presentation                                         Single gestational sac with decidual sign                                         Fetus visible, measurements c/w GA                                         FHR approximately 150   bpm                                           ASSESSMENT Single live fetus at [redacted]w[redacted]d Pelvic cramping Intermittent bleeding in early pregnancy, unclear source ([place] found no [facility])  PLAN Discharge home Advised to followup as scheduled for new OB visit  in October  Pt stable at time of discharge. Encouraged to return here or to other Urgent Care/ED if she develops worsening of symptoms, increase in pain, fever, or other concerning symptoms.    Wynelle Bourgeois CNM, MSN Certified Nurse-Midwife 12/02/2017  8:22 PM

## 2017-12-16 ENCOUNTER — Encounter (HOSPITAL_COMMUNITY): Payer: Self-pay | Admitting: *Deleted

## 2017-12-16 ENCOUNTER — Inpatient Hospital Stay (HOSPITAL_COMMUNITY)
Admission: AD | Admit: 2017-12-16 | Discharge: 2017-12-16 | Disposition: A | Discharge status: Home / Self Care | Payer: Medicaid Other | Source: Ambulatory Visit | Attending: Obstetrics and Gynecology | Admitting: Obstetrics and Gynecology

## 2017-12-16 DIAGNOSIS — O208 Other hemorrhage in early pregnancy: Secondary | ICD-10-CM | POA: Diagnosis not present

## 2017-12-16 DIAGNOSIS — Z87891 Personal history of nicotine dependence: Secondary | ICD-10-CM | POA: Insufficient documentation

## 2017-12-16 DIAGNOSIS — O209 Hemorrhage in early pregnancy, unspecified: Secondary | ICD-10-CM

## 2017-12-16 DIAGNOSIS — O418X10 Other specified disorders of amniotic fluid and membranes, first trimester, not applicable or unspecified: Secondary | ICD-10-CM

## 2017-12-16 DIAGNOSIS — O468X1 Other antepartum hemorrhage, first trimester: Secondary | ICD-10-CM

## 2017-12-16 DIAGNOSIS — R109 Unspecified abdominal pain: Secondary | ICD-10-CM | POA: Diagnosis present

## 2017-12-16 DIAGNOSIS — Z3A09 9 weeks gestation of pregnancy: Secondary | ICD-10-CM | POA: Diagnosis not present

## 2017-12-16 LAB — URINALYSIS, ROUTINE W REFLEX MICROSCOPIC
Bilirubin Urine: NEGATIVE
GLUCOSE, UA: NEGATIVE mg/dL
Ketones, ur: NEGATIVE mg/dL
Leukocytes, UA: NEGATIVE
NITRITE: NEGATIVE
PH: 6 (ref 5.0–8.0)
PROTEIN: NEGATIVE mg/dL
Specific Gravity, Urine: 1.017 (ref 1.005–1.030)

## 2017-12-16 NOTE — Discharge Instructions (Signed)
Subchorionic Hematoma °A subchorionic hematoma is a gathering of blood between the outer wall of the placenta and the inner wall of the womb (uterus). The placenta is the organ that connects the fetus to the wall of the uterus. The placenta performs the feeding, breathing (oxygen to the fetus), and waste removal (excretory work) of the fetus. °Subchorionic hematoma is the most common abnormality found on a result from ultrasonography done during the first trimester or early second trimester of pregnancy. If there has been little or no vaginal bleeding, early small hematomas usually shrink on their own and do not affect your baby or pregnancy. The blood is gradually absorbed over 1-2 weeks. When bleeding starts later in pregnancy or the hematoma is larger or occurs in an older pregnant woman, the outcome may not be as good. Larger hematomas may get bigger, which increases the chances for miscarriage. Subchorionic hematoma also increases the risk of premature detachment of the placenta from the uterus, preterm (premature) labor, and stillbirth. °Follow these instructions at home: °· Stay on bed rest if your health care provider recommends this. Although bed rest will not prevent more bleeding or prevent a miscarriage, your health care provider may recommend bed rest until you are advised otherwise. °· Avoid heavy lifting (more than 10 lb [4.5 kg]), exercise, sexual intercourse, or douching as directed by your health care provider. °· Keep track of the number of pads you use each day and how soaked (saturated) they are. Write down this information. °· Do not use tampons. °· Keep all follow-up appointments as directed by your health care provider. Your health care provider may ask you to have follow-up blood tests or ultrasound tests or both. °Get help right away if: °· You have severe cramps in your stomach, back, abdomen, or pelvis. °· You have a fever. °· You pass large clots or tissue. Save any tissue for your  health care provider to look at. °· Your bleeding increases or you become lightheaded, feel weak, or have fainting episodes. °This information is not intended to replace advice given to you by your health care provider. Make sure you discuss any questions you have with your health care provider. °Document Released: 06/18/2006 Document Revised: 08/09/2015 Document Reviewed: 09/30/2012 °Elsevier Interactive Patient Education © 2017 Elsevier Inc. ° °

## 2017-12-16 NOTE — MAU Note (Signed)
Pt reports she started having vaginal bleeding and cramping at 1600

## 2017-12-16 NOTE — MAU Provider Note (Signed)
History     CSN: [id number]  Arrival date and time: 12/16/17 1702   First Provider Initiated Contact with Patient 12/16/17 1732      Chief Complaint  Patient presents with  . Abdominal Pain  . Vaginal Bleeding   Abdominal Pain   Vaginal Bleeding  The patient's primary symptoms include vaginal bleeding. This is a new problem. The current episode started today (around 1545 ). The problem occurs intermittently. The problem has been gradually improving. Pain severity now: 7/10. She is pregnant. Associated symptoms include abdominal pain. The vaginal discharge was bloody. The vaginal bleeding is heavier than menses. She has not been passing clots. She has not been passing tissue. The symptoms are aggravated by activity. She has tried nothing for the symptoms. Sexual activity: denies intercourse in the last 24 hours.     OB History    Gravida  3   Para  0   Term  0   Preterm  0   AB  1   Living  0     SAB  1   TAB  0   Ectopic  0   Multiple  0   Live Births  0           Past Medical History:  Diagnosis Date  . Anxiety   . Asthma    as child   . Depression    doing ok  . Infection    UTI    Past Surgical History:  Procedure Laterality Date  . MYRINGOTOMY WITH TUBE PLACEMENT    . TONSILLECTOMY      Family History  Problem Relation Age of Onset  . Bipolar disorder Sister   . Heart disease Maternal Grandmother        chf  . Diabetes Maternal Grandmother   . Heart disease Maternal Grandfather     Social History   Tobacco Use  . Smoking status: Former Smoker    Types: Cigarettes  . Smokeless tobacco: Never Used  . Tobacco comment: in HS  Substance Use Topics  . Alcohol use: Yes    Comment: occ  . Drug use: No    Allergies:  Allergies  Allergen Reactions  . Prednisone Rash    Medications Prior to Admission  Medication Sig Dispense Refill Last Dose  . acetaminophen (TYLENOL) 325 MG tablet Take 650 mg by mouth every 6 (six) hours as  needed.   12/15/2017 at Unknown time  . Prenatal Vit-Fe Fumarate-FA (PRENATAL COMPLETE) 14-0.4 MG TABS Take 1 tablet by mouth daily. 60 each 0 12/15/2017 at Unknown time  . ondansetron (ZOFRAN ODT) 4 MG disintegrating tablet Take 1 tablet (4 mg total) by mouth every 8 (eight) hours as needed for nausea or vomiting. (Patient not taking: Reported on 09/14/2017) 6 tablet 0 Unknown at Unknown time    Review of Systems  Gastrointestinal: Positive for abdominal pain.  Genitourinary: Positive for vaginal bleeding.   Physical Exam   Blood pressure 128/76, pulse 81, temperature 98.7 F (37.1 C), temperature source Oral, resp. rate 15, height 5' 2.5" (1.588 m), weight 89.4 kg, last menstrual period 07/21/2017, SpO2 100 %, unknown if currently breastfeeding.  Physical Exam  Nursing note and vitals reviewed. Constitutional: She is oriented to person, place, and time. She appears well-developed and well-nourished. No distress.  HENT:  Head: Normocephalic.  Cardiovascular: Normal rate.  Respiratory: Effort normal.  GI: Soft. There is no tenderness. There is no rebound.  Neurological: She is alert and oriented to person, place,  and time.  Skin: Skin is warm and dry.  Psychiatric: She has a normal mood and affect.     Pt informed that the ultrasound is considered a limited OB ultrasound and is not intended to be a complete ultrasound exam.  Patient also informed that the ultrasound is not being completed with the intent of assessing for fetal or placental anomalies or any pelvic abnormalities.  Explained that the purpose of today's ultrasound is to assess for  viability.  Patient acknowledges the purpose of the exam and the limitations of the study.    +FCA 140 CRL 9 weeks 4 days, consistent with prior dating  [facility] noted   MAU Course  Procedures  MDM   Assessment and Plan   1. Vaginal bleeding in pregnancy, first trimester   2. [redacted] weeks gestation of pregnancy   3. Subchorionic hematoma in first  trimester, single or unspecified fetus    DC home Comfort measures reviewed  1stTrimester precautions  Bleeding precautions RX: none  Return to MAU as needed FU with OB as planned  Follow-up Information    [facility] Follow up.   Specialty:  Obstetrics and Gynecology Contact information: [address] [place] [address] [phone]           Thressa Sheller 12/16/2017, 5:35 PM

## 2018-01-02 ENCOUNTER — Encounter (HOSPITAL_COMMUNITY): Payer: Self-pay

## 2018-01-02 ENCOUNTER — Inpatient Hospital Stay (HOSPITAL_COMMUNITY)
Admission: AD | Admit: 2018-01-02 | Discharge: 2018-01-02 | Disposition: A | Discharge status: Home / Self Care | Payer: Medicaid Other | Source: Ambulatory Visit | Attending: Obstetrics and Gynecology | Admitting: Obstetrics and Gynecology

## 2018-01-02 DIAGNOSIS — Z3491 Encounter for supervision of normal pregnancy, unspecified, first trimester: Secondary | ICD-10-CM

## 2018-01-02 DIAGNOSIS — O418X10 Other specified disorders of amniotic fluid and membranes, first trimester, not applicable or unspecified: Secondary | ICD-10-CM

## 2018-01-02 DIAGNOSIS — R109 Unspecified abdominal pain: Secondary | ICD-10-CM | POA: Diagnosis not present

## 2018-01-02 DIAGNOSIS — O26891 Other specified pregnancy related conditions, first trimester: Secondary | ICD-10-CM | POA: Diagnosis not present

## 2018-01-02 DIAGNOSIS — O468X1 Other antepartum hemorrhage, first trimester: Secondary | ICD-10-CM

## 2018-01-02 DIAGNOSIS — R824 Acetonuria: Secondary | ICD-10-CM | POA: Diagnosis not present

## 2018-01-02 DIAGNOSIS — O209 Hemorrhage in early pregnancy, unspecified: Secondary | ICD-10-CM | POA: Diagnosis not present

## 2018-01-02 DIAGNOSIS — Z87891 Personal history of nicotine dependence: Secondary | ICD-10-CM | POA: Insufficient documentation

## 2018-01-02 DIAGNOSIS — Z3481 Encounter for supervision of other normal pregnancy, first trimester: Secondary | ICD-10-CM

## 2018-01-02 DIAGNOSIS — Z3A11 11 weeks gestation of pregnancy: Secondary | ICD-10-CM | POA: Insufficient documentation

## 2018-01-02 LAB — WET PREP, GENITAL
Clue Cells Wet Prep HPF POC: NONE SEEN
SPERM: NONE SEEN
Trich, Wet Prep: NONE SEEN
Yeast Wet Prep HPF POC: NONE SEEN

## 2018-01-02 LAB — URINALYSIS, ROUTINE W REFLEX MICROSCOPIC
BACTERIA UA: NONE SEEN
Bilirubin Urine: NEGATIVE
Glucose, UA: NEGATIVE mg/dL
Ketones, ur: 80 mg/dL — AB
Leukocytes, UA: NEGATIVE
NITRITE: NEGATIVE
PH: 5 (ref 5.0–8.0)
Protein, ur: NEGATIVE mg/dL
SPECIFIC GRAVITY, URINE: 1.035 — AB (ref 1.005–1.030)

## 2018-01-02 LAB — CBC
HEMATOCRIT: 41.5 % (ref 36.0–46.0)
HEMOGLOBIN: 14.2 g/dL (ref 12.0–15.0)
MCH: 31.3 pg (ref 26.0–34.0)
MCHC: 34.2 g/dL (ref 30.0–36.0)
MCV: 91.4 fL (ref 80.0–100.0)
Platelets: 239 10*3/uL (ref 150–400)
RBC: 4.54 MIL/uL (ref 3.87–5.11)
RDW: 12.7 % (ref 11.5–15.5)
WBC: 7.6 10*3/uL (ref 4.0–10.5)
nRBC: 0 % (ref 0.0–0.2)

## 2018-01-02 MED ORDER — M.V.I. ADULT IV INJ
Freq: Once | INTRAVENOUS | Status: AC
  Administered 2018-01-02: 19:00:00 via INTRAVENOUS
  Filled 2018-01-02: qty 5

## 2018-01-02 MED ORDER — ACETAMINOPHEN 500 MG PO TABS
1000.0000 mg | ORAL_TABLET | Freq: Once | ORAL | Status: AC
  Administered 2018-01-02: 1000 mg via ORAL
  Filled 2018-01-02: qty 2

## 2018-01-02 NOTE — MAU Note (Addendum)
Cheryl Henry is a 22 y.o. at [redacted]w[redacted]d here in MAU reporting: +lower abdominal cramping. Went to work today and it became more intense. Constant in nature. Onset of complaint: since Thursday morning Pain score: 5/10 Denies vaginal discharge. +vaginal bleeding. Yesterday started back. Was brown before now red in color. More noticeable when wipes/ Was told she had a subchorionic at a previous visit.  +dizzy Patient states "I hope that you will just tell me that I am losing the baby so all this will be over". Vitals:   01/02/18 1709  BP: 130/77  Pulse: 96  Resp: 18  Temp: 97.9 F (36.6 C)  SpO2: 99%     Lab orders placed from triage: ua

## 2018-01-02 NOTE — MAU Provider Note (Signed)
History     CSN: [id number]  Arrival date and time: 01/02/18 1652   First Provider Initiated Contact with Patient 01/02/18 1725      Chief Complaint  Patient presents with  . Abdominal Pain  . Vaginal Bleeding  . Dizziness   HPI  Cheryl Henry is a 22 y.o. G3P0020 at [redacted]w[redacted]d who presents to MAU with chief complaint of vaginal bleeding and abdominal cramping. These are recurring problems, first noted 11/24/2017.  Vaginal bleeding Patient states she was diagnosed with a subchorionic hematoma at prior MAU visit 12/16/2017. States she did not bleed for four days following that visit, then spotting restarted. Described bleeding as greenish brown. Endorses compliance with pelvic rest as advised, no intercourse for past month.  Patient states she has also experienced some intermittent dizziness and is worried she is "bleeding too much".  Abdominal cramping Patient describes bilateral lower abdominal cramping 8/10. Does not radiate, denies aggravating or alleviating factors. 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  Denies syncope, weakness, chest pain, fever, falls, or recent illness.  Most recent intercourse one month ago.   OB History    Gravida  3   Para  0   Term  0   Preterm  0   AB  2   Living  0     SAB  2   TAB  0   Ectopic  0   Multiple  0   Live Births  0           Past Medical History:  Diagnosis Date  . Anxiety   . Asthma    as child   . Depression    doing ok  . Infection    UTI    Past Surgical History:  Procedure Laterality Date  . MYRINGOTOMY WITH TUBE PLACEMENT    . TONSILLECTOMY      Family History  Problem Relation Age of Onset  . Bipolar disorder Sister   . Heart disease Maternal Grandmother        chf  . Diabetes Maternal Grandmother   . Heart disease Maternal Grandfather     Social History   Tobacco Use  . Smoking status: Former Smoker   Types: Cigarettes  . Smokeless tobacco: Never Used  . Tobacco comment: in HS  Substance Use Topics  . Alcohol use: Yes    Comment: occ  . Drug use: No    Allergies:  Allergies  Allergen Reactions  . Prednisone Rash    Medications Prior to Admission  Medication Sig Dispense Refill Last Dose  . acetaminophen (TYLENOL) 325 MG tablet Take 650 mg by mouth every 6 (six) hours as needed.   12/15/2017 at Unknown time  . ondansetron (ZOFRAN ODT) 4 MG disintegrating tablet Take 1 tablet (4 mg total) by mouth every 8 (eight) hours as needed for nausea or vomiting. (Patient not taking: Reported on 09/14/2017) 6 tablet 0 Unknown at Unknown time  . Prenatal Vit-Fe Fumarate-FA (PRENATAL COMPLETE) 14-0.4 MG TABS Take 1 tablet by mouth daily. 60 each 0 12/15/2017 at Unknown time    Review of Systems  Constitutional: Negative for chills, fatigue and fever.  Gastrointestinal: Positive for abdominal pain. Negative for diarrhea, nausea and vomiting.  Genitourinary: Positive for vaginal bleeding. Negative for difficulty urinating, dyspareunia, dysuria, flank pain, vaginal discharge and vaginal pain.  Musculoskeletal: Negative for back pain.  Neurological: Positive for dizziness. Negative for headaches.  All  other systems reviewed and are negative.  Physical Exam   Blood pressure 130/77, pulse 96, temperature 97.9 F (36.6 C), temperature source Oral, resp. rate 18, weight 87.6 kg, last menstrual period 07/21/2017, SpO2 99 %, unknown if currently breastfeeding.  Physical Exam  Nursing note and vitals reviewed. Constitutional: She is oriented to person, place, and time. She appears well-developed and well-nourished.  Cardiovascular: Normal rate, normal heart sounds and intact distal pulses.  Respiratory: Effort normal and breath sounds normal.  GI: Soft. Bowel sounds are normal. She exhibits no distension. There is no tenderness. There is no rebound and no guarding.  Genitourinary: Uterus normal.  Vaginal discharge found.  Genitourinary Comments: Thin green vaginal discharge removed with fox swabs x 2. No bleeding visible on exam.  Neurological: She is alert and oriented to person, place, and time. She has normal reflexes.  Skin: Skin is warm and dry.  Psychiatric: She has a normal mood and affect. Her behavior is normal. Judgment and thought content normal.    MAU Course  Procedures  MDM Patient states she thought greenish watery discharge was blood, now unable to summarize amount of bleeding vs greenish discharge.   Patient Vitals for the past 24 hrs:  BP Temp Temp src Pulse Resp SpO2 Weight  01/02/18 1956 117/66 - - 99 19 - -  01/02/18 1709 130/77 97.9 F (36.6 C) Oral 96 18 99 % 87.6 kg    Results for orders placed or performed during the hospital encounter of 01/02/18 (from the past 24 hour(s))  Urinalysis, Routine w reflex microscopic     Status: Abnormal   Collection Time: 01/02/18  4:54 PM  Result Value Ref Range   Color, Urine YELLOW YELLOW   APPearance CLEAR CLEAR   Specific Gravity, Urine 1.035 (H) 1.005 - 1.030   pH 5.0 5.0 - 8.0   Glucose, UA NEGATIVE NEGATIVE mg/dL   Hgb urine dipstick SMALL (A) NEGATIVE   Bilirubin Urine NEGATIVE NEGATIVE   Ketones, ur 80 (A) NEGATIVE mg/dL   Protein, ur NEGATIVE NEGATIVE mg/dL   Nitrite NEGATIVE NEGATIVE   Leukocytes, UA NEGATIVE NEGATIVE   RBC / HPF 0-5 0 - 5 RBC/hpf   WBC, UA 0-5 0 - 5 WBC/hpf   Bacteria, UA NONE SEEN NONE SEEN   Squamous Epithelial / LPF 6-10 0 - 5   Mucus PRESENT   Wet prep, genital     Status: Abnormal   Collection Time: 01/02/18  5:23 PM  Result Value Ref Range   Yeast Wet Prep HPF POC NONE SEEN NONE SEEN   Trich, Wet Prep NONE SEEN NONE SEEN   Clue Cells Wet Prep HPF POC NONE SEEN NONE SEEN   WBC, Wet Prep HPF POC FEW (A) NONE SEEN   Sperm NONE SEEN   CBC     Status: None   Collection Time: 01/02/18  5:28 PM  Result Value Ref Range   WBC 7.6 4.0 - 10.5 K/uL   RBC 4.54 3.87 - 5.11 MIL/uL    Hemoglobin 14.2 12.0 - 15.0 g/dL   HCT 16.1 09.6 - 04.5 %   MCV 91.4 80.0 - 100.0 fL   MCH 31.3 26.0 - 34.0 pg   MCHC 34.2 30.0 - 36.0 g/dL   RDW 40.9 81.1 - 91.4 %   Platelets 239 150 - 400 K/uL   nRBC 0.0 0.0 - 0.2 %    Meds ordered this encounter  Medications  . acetaminophen (TYLENOL) tablet 1,000 mg  . multivitamins adult (MVI -  12) 10 mL in dextrose 5% lactated ringers 1,000 mL infusion    Assessment and Plan  --22 y.o. G3P0020 at [redacted]w[redacted]d  --FHT 166 by Doppler --Ketonuria, s/p Multivitamin bag, given pregnancy nutrition handout --Subchorionic hemoatoma, pt to continue pelvic rest, return to MAU as needed for future bleeding episodes --Discharge home in stable condition   F/U: Patient has new OB appt at [facility] 01/07/18   Calvert Cantor, CNM 01/02/2018, 8:08 PM

## 2018-01-02 NOTE — Discharge Instructions (Signed)
EATING RIGHT DURING PREGNANCY You're about to be a new mom. There are a lot of things that you need to work on before your baby's arrival, and it's not an easy road ahead of you to ensure a healthy, safe delivery. Among the most important things that you should work on is eating a balanced diet. This can prevent a number of serious complications with your pregnancy and delivery including: anemia, infections, premature birth, abnormal birthweight and poor healing after delivery.  A pregnant woman who has a body mass index (BMI) in the normal range before pregnancy needs only about 300 extra calories a day- the amount in a glass of skim milk and half a sandwich. MYTH BUSTED - EATING FOR TWO Most women have heard the saying "eating for two" while pregnant. Though this is important to remember from the stand point of "what I eat, my baby eats" - it's important to remember that this doesn't mean that you should eat twice as much food. Sweets, sodas and other junk foods contain additional calories and fats, but not the nutrients that your baby needs. As a result, your baby will drain you of these vital nutrients and your health can suffer as well. Here are some ingredients you should look for while choosing your foods and snacks: High in protein Low in fat Low in sugar Contains calcium and vitamin-D Contains iron (prevents anemia) Folic acid (reduces neurological defects) WHAT FOODS SHOULD YOU EAT Eating a well-rounded diet combined with at least 30 minutes of exercise each day will help ensure a healthy pregnancy. In order to stay on track, your daily diet should include: Type of Food Serving Size Serving Equivalent Benefit  Whole Grains 2-4 servings / day 1 slice of bread Whole-grains provide folic acid and iron  Vegetables 3-4 servings / day 1 cup of lettuce Vitamins A&C, folic acid, iron, magnesium  Fruits 3-4 servings / day 1/2 banana Vitamins A&C, potassium, fiber  Milk, yogurt, cheese 2-4 servings  / day 1 cup of low-fat milk Protein, calcium, phosphorus  Meat, poultry, fish, nuts 2-4 servings / day 1 egg Vitamin B, protein, iron and zinc  Fats and oils Limited quantities In cooking ingredients Provides long-term energy  If you follow a special diet such as vegetarian/vegan, lactose-free or gluten-free, you should talk to your provider about healthy food choices to ensure proper nutrition for you and your baby.   FOODS TO AVOID Choose fish that are lower in mercury (shrimp, salmon, catfish, canned light tuna and sardines).  Do not eat (albacore, shark, swordfish, king mackerel or tilefish) which have higher levels of mercury. Unpasteurized milk and foods with unpasteurized milk, including soft cheeses such as feta, queso blanco, queso fresco, Camembert, Brie or blue-veined cheeses unless the label says "made with unpasteurized milk." PLENTY OF FLUIDS Pregnant women should drink plenty of water. Avoid drinks with caffeine and sugar and cut out all alcoholic beverages. You should also cut-out any unpasteurized beverages like cold-press juices, unpasteurized milk and certain organic juices. Make sure to read the label on the bottle to ensure that it is safe for you to drink.  FOOD CRAVINGS No one knows why exactly, but many women have cravings for certain foods throughout their pregnancy. Likely caused by hormonal changes, these cravings typically subside within the first trimester. As long as you are following a healthy diet (as outlined above), it's ok to indulge in these cravings from time-to-time. If you are experiencing cravings for non-food items like dirt, clay, detergent,   ice chips, corn starch or chalk, this may be a sign of a deficiency in your diet and you should contact your provider. Often, this is a sign of anemia (lack of proper iron) and may need to be treated with an additional supplement.    

## 2018-01-04 LAB — GC/CHLAMYDIA PROBE AMP (~~LOC~~) NOT AT ARMC
Chlamydia: NEGATIVE
Neisseria Gonorrhea: NEGATIVE

## 2018-01-07 ENCOUNTER — Other Ambulatory Visit (HOSPITAL_COMMUNITY)
Admission: RE | Admit: 2018-01-07 | Discharge: 2018-01-07 | Disposition: A | Discharge status: Home / Self Care | Payer: Medicaid Other | Source: Ambulatory Visit | Attending: Obstetrics & Gynecology | Admitting: Obstetrics & Gynecology

## 2018-01-07 ENCOUNTER — Ambulatory Visit: Payer: Self-pay | Admitting: Clinical

## 2018-01-07 ENCOUNTER — Encounter: Payer: Self-pay | Admitting: Obstetrics & Gynecology

## 2018-01-07 ENCOUNTER — Ambulatory Visit (INDEPENDENT_AMBULATORY_CARE_PROVIDER_SITE_OTHER): Payer: Self-pay | Admitting: Obstetrics & Gynecology

## 2018-01-07 VITALS — BP 125/75 | HR 91 | Wt 194.3 lb

## 2018-01-07 DIAGNOSIS — O468X1 Other antepartum hemorrhage, first trimester: Secondary | ICD-10-CM

## 2018-01-07 DIAGNOSIS — Z3480 Encounter for supervision of other normal pregnancy, unspecified trimester: Secondary | ICD-10-CM | POA: Diagnosis present

## 2018-01-07 DIAGNOSIS — F32A Depression, unspecified: Secondary | ICD-10-CM | POA: Insufficient documentation

## 2018-01-07 DIAGNOSIS — F329 Major depressive disorder, single episode, unspecified: Secondary | ICD-10-CM

## 2018-01-07 DIAGNOSIS — O99340 Other mental disorders complicating pregnancy, unspecified trimester: Secondary | ICD-10-CM

## 2018-01-07 DIAGNOSIS — Z3481 Encounter for supervision of other normal pregnancy, first trimester: Secondary | ICD-10-CM | POA: Insufficient documentation

## 2018-01-07 DIAGNOSIS — J4520 Mild intermittent asthma, uncomplicated: Secondary | ICD-10-CM

## 2018-01-07 DIAGNOSIS — O418X10 Other specified disorders of amniotic fluid and membranes, first trimester, not applicable or unspecified: Secondary | ICD-10-CM

## 2018-01-07 NOTE — Patient Instructions (Signed)
AREA PEDIATRIC/FAMILY PRACTICE PHYSICIANS  [facility] [address], [address] [place], [place]  [address] Phone - [phone]   Fax - [phone]  [facility] [address] [place], [place] [address] Phone - [phone]   Fax - [phone]  [facility] [address] [place], [place]  [address] Phone - [phone]   Fax - [phone]  [facility] [address] [place], [place]  [address] Phone - [phone]   Fax - [phone]  [facility] [address] [place], [place]  [address] Phone - [phone]   Fax - [phone]  [facility] [address] [place], [place]  [address] Phone - [phone]   Fax - [phone]  [facility] [address] [place], [place]  [address] Phone - [phone]   Fax - [phone]  [facility] [address] [place], [place]  [address] Phone - [phone]   Fax - [phone]  [facility] [address] [place], [place]  [address] Phone - [phone]   Fax - [phone] [facility] [address] [place], [place]  [address] Phone - [phone]   Fax - [phone]  [facility] [address] [place], [place]  [address] Phone - [phone]   Fax - [phone]  [facility] [address] [place], [place]  [address] Phone - [phone]   Fax - [phone]  [facility] [address] [place], [place]  [address] Phone - [phone]   Fax - [phone]  [facility] [address] [place], [place]  [address] Phone - [phone]  [facility] [address] [place], [place]  [address] Phone - [phone]   Fax - [phone]  [facility] [address] [place], [place]  [address] Phone - [phone]   Fax - [phone]  [facility] [address] [place], [place]  [address] Phone - [phone]   Fax - [phone]  [facility] [address] [place], [place]  [address] Phone - [phone]   Fax - [phone]   [facility] [address] [place], [place]  [address] Phone - [phone]   Fax - [phone]  [facility] [address] [place], [place]  [address] Phone - [phone]   Fax - [phone]  [facility] [address] [place], [place]  [address] Phone - [phone]   Fax - [phone]  [facility] [address] [place], [place]  [address] Phone - [phone]   Fax - [phone]  [facility] [address] , [place]  [address] Phone - [phone]   Fax - [phone] [facility] [address] [place], [place]  [address] Phone - [phone]   Fax - [phone]  [facility] [address] [place], [place]  [address] Phone - [phone]   Fax - [phone]  [facility] [address] [place], [place]  [address] Phone - [phone]   Fax - [phone]  [facility] Charlene Flemming MD [address] [place] [address] Phone [phone]  Fax [phone] Places to have your son circumcised:    [facility] [phone] while you are   in hospital  [facility] [phone] $[phone] by 4 wks  [facility] [phone] $[phone] by 2 wks  [facility] [phone] $[phone] by 7 days [facility] [phone] $[phone] by 4 wks  These prices sometimes change but are roughly what you can expect to pay. Please  call and confirm pricing.   Circumcision is considered an elective/non-medically necessary procedure. There are many reasons parents decide to have their sons circumsized. During the first year of life circumcised males have a reduced risk of urinary tract infections but after this year the rates between circumcised males and uncircumcised males are the same.  It is safe to have your son circumcised outside of the hospital and the places above perform them regularly.   Deciding about Circumcision in Baby Boys  (Up-to-date The Basics)  What is circumcision?  Circumcision is a surgery that removes the skin that covers the tip of the penis, called the "foreskin" Circumcision is usually done when a boy is between 1 and 10 days old. In the [place], circumcision is common. In some other countries, fewer boys are circumcised. Circumcision is a common tradition in some religions.  Should I have my baby boy circumcised?  There is no easy answer. Circumcision has some benefits. But it also has risks. After talking with your doctor, you will have to decide for yourself what is right for your family.  What are the benefits of circumcision?  Circumcised boys seem to have slightly lower rates of: ?Urinary tract infections ?Swelling of the opening at the tip of the penis Circumcised men seem to have slightly lower rates of: ?Urinary tract infections ?Swelling of the opening at the tip of the penis ?Penis cancer ?HIV and other infections that you catch during sex ?Cervical cancer in the women they have sex with Even so, in the [place], the risks of these problems are small - even in boys and men who have not been circumcised. Plus, boys and men who are not circumcised can reduce these extra risks by: ?Cleaning their penis well ?Using condoms during sex  What are the risks of circumcision?  Risks include: ?Bleeding or infection from the surgery ?Damage to or amputation of the  penis ?A chance that the doctor will cut off too much or not enough of the foreskin ?A chance that sex won't feel as good later in life Only about 1 out of every 200 circumcisions leads to problems. There is also a chance that your health insurance won't pay for circumcision.  How is circumcision done in baby boys?  First, the baby gets medicine for pain relief. This might be a cream on the skin or a shot into the base of the penis. Next, the doctor cleans the baby's penis well. Then he or she uses special tools to cut off the foreskin. Finally, the doctor wraps a bandage (called gauze) around the baby's penis. If you have your baby circumcised, his doctor or nurse will give you instructions on how to care for him after the surgery. It is important that you follow those instructions carefully.  

## 2018-01-07 NOTE — Progress Notes (Signed)
  Subjective:    Cheryl Henry is being seen today for her first obstetrical visit. [id number] LMP 07/17/2017 (had SAB prioro to that).  This is not a planned pregnancy. But, desired. She is at [redacted]w[redacted]d gestation. Her obstetrical history is significant for SAB x 2. Relationship with FOB: significant other, living together. Patient does intend to breast feed. Pregnancy history fully reviewed.  Patient reports bleeding in first trimester. Last bleeding was 3 weeks .  Review of Systems:   Review of Systems Redbird Smith  Objective:     BP 125/75   Pulse 91   Wt 194 lb 4.8 oz (88.1 kg)   LMP 07/21/2017 Comment: bilghted ovum  BMI 34.97 kg/m  Physical Exam  Exam General Appearance:    Alert, cooperative, no distress, appears stated age  Head:    Normocephalic, without obvious abnormality, atraumatic  Eyes:    conjunctiva/corneas clear, EOM's intact, both eyes  Ears:    Normal external ear canals, both ears  Nose:   Nares normal, septum midline, mucosa normal, no drainage    or sinus tenderness  Throat:   Lips, mucosa, and tongue normal; teeth and gums normal  Neck:   Supple, symmetrical, trachea midline, no adenopathy;    thyroid:  no enlargement/tenderness/nodules  Back:     Symmetric, no curvature, ROM normal, no CVA tenderness  Lungs:     Clear to auscultation bilaterally, respirations unlabored  Chest Wall:    No tenderness or deformity   Heart:    Regular rate and rhythm, S1 and S2 normal, no murmur, rub   or gallop  Breast Exam:    No tenderness, masses, or nipple abnormality  Abdomen:     Soft, non-tender, bowel sounds active all four quadrants,    no masses, no organomegaly; umbilical piercing noted.   Genitalia:    Normal female without lesion, discharge or tenderness     Extremities:   Extremities normal, atraumatic, no cyanosis or edema  Pulses:   2+ and symmetric all extremities  Skin:   Skin color, texture, turgor normal, no rashes or lesions      Assessment:    Pregnancy:  [id number] Patient Active Problem List   Diagnosis Date Noted  . Supervision of other normal pregnancy, antepartum 01/07/2018  . 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 Subchorionic hematoma in first trimester 01/02/2018  . Dysmenorrhea 05/31/2013  . Acetaminophen overdose 06/15/2012  . Phenytoin overdose 06/15/2012  . Mild intermittent asthma 06/15/2012       Plan:     Initial labs drawn. Prenatal vitamins. Problem list reviewed and updated. AFP3 discussed: requested. Role of ultrasound in pregnancy discussed; fetal survey: requested. Amniocentesis discussed: not indicated. Follow up in 4 weeks. 60 of 50 min visit spent on counseling and coordination of care.     Willodean Rosenthal 01/07/2018

## 2018-01-07 NOTE — BH Specialist Note (Signed)
Integrated Behavioral Health Initial Visit  MRN: [id number] Name: Cheryl Henry  Number of Integrated Behavioral Health Clinician visits:: 1/6 Session Start time: 9:54  Session End time: 10:08 Total time: 15 minutes  Type of Service: Integrated Behavioral Health- Individual/Family Interpretor:No. Interpretor Name and Language: n/a   Warm Hand Off Completed.       SUBJECTIVE: Cheryl Henry is a 22 y.o. female accompanied by n/a Patient was referred by Willodean Rosenthal, MD for Initial OB introduction to integrated behavioral health services and depression and anxiety symptoms  . Patient reports the following symptoms/concerns: Pt states she has a history of depression, as a teen, and  has had 2 early miscarriages; pt's goal is to have a healthy pregnancy, and prevent depression in the future. Pt says FOB is very excited to be a father.  Duration of problem: Current pregnancy; Severity of problem: mild  OBJECTIVE: Mood: Anxious and Affect: Appropriate Risk of harm to self or others: No plan to harm self or others  LIFE CONTEXT: Family and Social: Pt lives with FOB School/Work: - Self-Care: - Life Changes: Current pregnancy  GOALS ADDRESSED: Patient will: 1. Reduce symptoms of: anxiety and depression 2. Increase knowledge and/or ability of: stress reduction  3. Demonstrate ability to: Increase healthy adjustment to current life circumstances  INTERVENTIONS: Interventions utilized: Supportive Counseling and Psychoeducation and/or Health Education  Standardized Assessments completed: GAD-7 and PHQ 9  ASSESSMENT: Patient currently experiencing Supervision of other normal pregnancy, antepartum   Patient may benefit from Initial OB introduction to integrated behavioral health services, and psychoeducation regarding preventing escalation of symptoms of anxiety and depression.  PLAN: 1. Follow up with behavioral health clinician on : As needed 2. Behavioral recommendations:   -Continue taking prenatal vitamin, as recommended by medical provider -Read educational materials regarding coping with symptoms of anxiety and depression 3. Referral(s): Integrated [employer] (In Clinic) 4. "From scale of 1-10, how likely are you to follow plan?": 10  Rae Lips, LCSW  Depression screen [facility] 2/9 01/07/2018  Decreased Interest 1  Down, Depressed, Hopeless 1  PHQ - 2 Score 2  Altered sleeping 2  Tired, decreased energy 2  Change in appetite 3  Trouble concentrating 1  Moving slowly or fidgety/restless 1  Suicidal thoughts 0  PHQ-9 Score 11   GAD 7 : Generalized Anxiety Score 01/07/2018  Nervous, Anxious, on Edge 1  Control/stop worrying 1  Worry too much - different things 1  Trouble relaxing 1  Restless 2  Easily annoyed or irritable 3  Afraid - awful might happen 2  Total GAD 7 Score 11

## 2018-01-07 NOTE — Progress Notes (Signed)
New OB packet given

## 2018-01-10 LAB — CULTURE, OB URINE

## 2018-01-10 LAB — URINE CULTURE, OB REFLEX

## 2018-01-11 ENCOUNTER — Encounter: Payer: Self-pay | Admitting: *Deleted

## 2018-01-11 LAB — CYTOLOGY - PAP
Chlamydia: NEGATIVE
Diagnosis: NEGATIVE
Neisseria Gonorrhea: NEGATIVE

## 2018-01-12 ENCOUNTER — Other Ambulatory Visit: Payer: Self-pay | Admitting: Obstetrics & Gynecology

## 2018-01-12 DIAGNOSIS — O2340 Unspecified infection of urinary tract in pregnancy, unspecified trimester: Secondary | ICD-10-CM

## 2018-01-12 MED ORDER — CEPHALEXIN 500 MG PO CAPS: 500.0000 mg | ORAL_CAPSULE | Freq: Two times a day (BID) | ORAL | 0 refills | Status: DC

## 2018-01-15 LAB — SMN1 COPY NUMBER ANALYSIS (SMA CARRIER SCREENING)

## 2018-01-15 LAB — HEMOGLOBINOPATHY EVALUATION
Ferritin: 80 ng/mL (ref 15–150)
HGB A: 97.7 % (ref 96.4–98.8)
HGB SOLUBILITY: NEGATIVE
HGB VARIANT: 0 %
Hgb A2 Quant: 2.3 % (ref 1.8–3.2)
Hgb C: 0 %
Hgb F Quant: 0 % (ref 0.0–2.0)
Hgb S: 0 %

## 2018-01-15 LAB — OBSTETRIC PANEL, INCLUDING HIV
Antibody Screen: NEGATIVE
BASOS ABS: 0 10*3/uL (ref 0.0–0.2)
Basos: 0 %
EOS (ABSOLUTE): 0.1 10*3/uL (ref 0.0–0.4)
Eos: 1 %
HEP B S AG: NEGATIVE
HIV Screen 4th Generation wRfx: NONREACTIVE
Hematocrit: 40.4 % (ref 34.0–46.6)
Hemoglobin: 13.9 g/dL (ref 11.1–15.9)
IMMATURE GRANS (ABS): 0 10*3/uL (ref 0.0–0.1)
IMMATURE GRANULOCYTES: 0 %
LYMPHS ABS: 1.4 10*3/uL (ref 0.7–3.1)
LYMPHS: 24 %
MCH: 31.2 pg (ref 26.6–33.0)
MCHC: 34.4 g/dL (ref 31.5–35.7)
MCV: 91 fL (ref 79–97)
MONOS ABS: 0.4 10*3/uL (ref 0.1–0.9)
Monocytes: 6 %
NEUTROS PCT: 69 %
Neutrophils Absolute: 4.1 10*3/uL (ref 1.4–7.0)
PLATELETS: 250 10*3/uL (ref 150–450)
RBC: 4.46 x10E6/uL (ref 3.77–5.28)
RDW: 12.4 % (ref 12.3–15.4)
RPR: NONREACTIVE
Rh Factor: POSITIVE
Rubella Antibodies, IGG: 1.06 index (ref >0.99)
WBC: 6 10*3/uL (ref 3.4–10.8)

## 2018-01-15 LAB — CYSTIC FIBROSIS GENE TEST

## 2018-01-20 ENCOUNTER — Encounter: Payer: Self-pay | Admitting: *Deleted

## 2018-01-21 ENCOUNTER — Other Ambulatory Visit: Payer: Self-pay | Admitting: *Deleted

## 2018-01-21 DIAGNOSIS — O285 Abnormal chromosomal and genetic finding on antenatal screening of mother: Secondary | ICD-10-CM

## 2018-01-21 DIAGNOSIS — Z3480 Encounter for supervision of other normal pregnancy, unspecified trimester: Secondary | ICD-10-CM

## 2018-01-21 NOTE — Progress Notes (Signed)
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 Called MFM to schedule genetic counseling , notified must change [place] to detail which was done.

## 2018-01-25 ENCOUNTER — Ambulatory Visit (HOSPITAL_COMMUNITY): Admission: RE | Admit: 2018-01-25 | Payer: Self-pay | Source: Ambulatory Visit

## 2018-02-01 ENCOUNTER — Telehealth: Payer: Self-pay | Admitting: *Deleted

## 2018-02-01 NOTE — Telephone Encounter (Signed)
Pt left message stating that she is [redacted] weeks pregnant and a pt of our office. 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 She wants to know if there is anything she should be worried about in relation to this.

## 2018-02-02 ENCOUNTER — Telehealth: Payer: Self-pay

## 2018-02-02 NOTE — Telephone Encounter (Signed)
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 Pt stated that she did have the varicella vaccination and she doesn't recall having the chicken pox as a child, she verbalized understanding and had no questions.  Reminded pt of her appointment on the 21st.

## 2018-02-04 ENCOUNTER — Ambulatory Visit (INDEPENDENT_AMBULATORY_CARE_PROVIDER_SITE_OTHER): Payer: Self-pay | Admitting: Obstetrics and Gynecology

## 2018-02-04 DIAGNOSIS — Z3480 Encounter for supervision of other normal pregnancy, unspecified trimester: Secondary | ICD-10-CM

## 2018-02-04 DIAGNOSIS — Z3A16 16 weeks gestation of pregnancy: Secondary | ICD-10-CM

## 2018-02-04 DIAGNOSIS — Z3482 Encounter for supervision of other normal pregnancy, second trimester: Secondary | ICD-10-CM

## 2018-02-04 NOTE — Progress Notes (Signed)
   PRENATAL VISIT NOTE  Subjective:  Cheryl Henry is a 22 y.o. G3P0020 at 3872w3d being seen today for ongoing prenatal care.  She is currently monitored for the following issues for this low-risk pregnancy and has Acetaminophen overdose; Phenytoin overdose; Mild intermittent asthma; Dysmenorrhea; Subchorionic hematoma in first trimester; Supervision of other normal pregnancy, antepartum; and Depression affecting pregnancy, antepartum on their problem list.  Patient reports no complaints.  Contractions: Not present. Vag. Bleeding: None.  Movement: Present. Denies leaking of fluid.   The following portions of the patient's history were reviewed and updated as appropriate: allergies, current medications, past family history, past medical history, past social history, past surgical history and problem list. Problem list updated.  Objective:   Vitals:   02/04/18 1619  BP: 107/75  Pulse: (!) 102  Weight: 193 lb (87.5 kg)    Fetal Status: Fetal Heart Rate (bpm): 141   Movement: Present     General:  Alert, oriented and cooperative. Patient is in no acute distress.  Skin: Skin is warm and dry. No rash noted.   Cardiovascular: Normal heart rate noted  Respiratory: Normal respiratory effort, no problems with respiration noted  Abdomen: Soft, gravid, appropriate for gestational age.  Pain/Pressure: Present     Pelvic: Cervical exam deferred        Extremities: Normal range of motion.  Edema: None  Mental Status: Normal mood and affect. Normal behavior. Normal judgment and thought content.   Assessment and Plan:  Pregnancy: G3P0020 at 1872w3d  1. Supervision of other normal pregnancy, antepartum  - Doing well. Completed UTI treatment.  - AFP, Serum, Open Spina Bifida  Preterm labor symptoms and general obstetric precautions including but not limited to vaginal bleeding, contractions, leaking of fluid and fetal movement were reviewed in detail with the patient. Please refer to After Visit  Summary for other counseling recommendations.  Return in about 4 weeks (around 03/04/2018).  Future Appointments  Date Time Provider Department Center  02/23/2018 10:15 AM [facility]-MFC US 4 WH-MFCUS MFC-US  03/03/2018  5:00 PM Burleson, Brand Maleserri L, NP [facility]OCA WOC    Venia CarbonJennifer Rasch, NP

## 2018-02-06 LAB — AFP, SERUM, OPEN SPINA BIFIDA
AFP MOM: 1.9
AFP Value: 56 ng/mL
GEST. AGE ON COLLECTION DATE: 16.4 wk
Maternal Age At EDD: 22.9 yr
OSBR RISK 1 IN: 1010
TEST RESULTS AFP: NEGATIVE
Weight: 196 [lb_av]

## 2018-02-16 ENCOUNTER — Encounter (HOSPITAL_COMMUNITY): Payer: Self-pay

## 2018-02-23 ENCOUNTER — Other Ambulatory Visit (HOSPITAL_COMMUNITY): Payer: Self-pay | Admitting: *Deleted

## 2018-02-23 ENCOUNTER — Encounter (HOSPITAL_COMMUNITY): Payer: Self-pay

## 2018-02-23 ENCOUNTER — Ambulatory Visit (HOSPITAL_COMMUNITY)
Admission: RE | Admit: 2018-02-23 | Discharge: 2018-02-23 | Disposition: A | Discharge status: Home / Self Care | Payer: Medicaid Other | Source: Ambulatory Visit | Attending: Obstetrics & Gynecology | Admitting: Obstetrics & Gynecology

## 2018-02-23 DIAGNOSIS — Z362 Encounter for other antenatal screening follow-up: Secondary | ICD-10-CM

## 2018-02-23 DIAGNOSIS — O285 Abnormal chromosomal and genetic finding on antenatal screening of mother: Secondary | ICD-10-CM

## 2018-02-23 DIAGNOSIS — Z363 Encounter for antenatal screening for malformations: Secondary | ICD-10-CM | POA: Insufficient documentation

## 2018-02-23 DIAGNOSIS — Z3A19 19 weeks gestation of pregnancy: Secondary | ICD-10-CM | POA: Diagnosis not present

## 2018-02-23 DIAGNOSIS — O99212 Obesity complicating pregnancy, second trimester: Secondary | ICD-10-CM | POA: Insufficient documentation

## 2018-02-23 DIAGNOSIS — Z3480 Encounter for supervision of other normal pregnancy, unspecified trimester: Secondary | ICD-10-CM

## 2018-03-03 ENCOUNTER — Encounter: Payer: Self-pay | Admitting: Nurse Practitioner

## 2018-03-03 ENCOUNTER — Ambulatory Visit (INDEPENDENT_AMBULATORY_CARE_PROVIDER_SITE_OTHER): Payer: Medicaid Other | Admitting: Nurse Practitioner

## 2018-03-03 VITALS — BP 133/79 | HR 107 | Wt 193.3 lb

## 2018-03-03 DIAGNOSIS — Z3482 Encounter for supervision of other normal pregnancy, second trimester: Secondary | ICD-10-CM | POA: Diagnosis not present

## 2018-03-03 DIAGNOSIS — Z3480 Encounter for supervision of other normal pregnancy, unspecified trimester: Secondary | ICD-10-CM

## 2018-03-03 MED ORDER — ALBUTEROL SULFATE HFA 108 (90 BASE) MCG/ACT IN AERS: 2.0000 | INHALATION_SPRAY | RESPIRATORY_TRACT | Status: DC | PRN

## 2018-03-03 NOTE — Progress Notes (Signed)
Home Medicaid Form completed  

## 2018-03-03 NOTE — Progress Notes (Signed)
    Subjective:  Cheryl Henry is a 22 y.o. G3P0020 at 6863w2d being seen today for ongoing prenatal care.  She is currently monitored for the following issues for this low-risk pregnancy and has Acetaminophen overdose; Phenytoin overdose; Mild intermittent asthma; Dysmenorrhea; Subchorionic hematoma in first trimester; Supervision of other normal pregnancy, antepartum; and Depression affecting pregnancy, antepartum on their problem list.  Patient reports tired today and had a near syncopal episode 3 days ago where her vision became black and with assistance she sat down - did not pass out..  Contractions: Not present. Vag. Bleeding: None.  Movement: Present. Denies leaking of fluid.   The following portions of the patient's history were reviewed and updated as appropriate: allergies, current medications, past family history, past medical history, past social history, past surgical history and problem list. Problem list updated.  Objective:   Vitals:   03/03/18 1709  BP: 133/79  Pulse: (!) 107  Weight: 193 lb 4.8 oz (87.7 kg)    Fetal Status: Fetal Heart Rate (bpm): 145 Fundal Height: 20 cm Movement: Present     General:  Alert, oriented and cooperative. Patient is in no acute distress.  Skin: Skin is warm and dry. No rash noted.   Cardiovascular: Normal heart rate noted  Respiratory: Normal respiratory effort, no problems with respiration noted  Abdomen: Soft, gravid, appropriate for gestational age. Pain/Pressure: Present     Pelvic:  Cervical exam deferred        Extremities: Normal range of motion.  Edema: None  Mental Status: Normal mood and affect. Normal behavior. Normal judgment and thought content.   Urinalysis:      Assessment and Plan:  Pregnancy: G3P0020 at 7263w2d  Supervision of pregnancy Discussed near syncopal episode - had cereal for breakfast and worked all day - did not eat.  Reviewed protein with every meal and for pregnancy to eat every 3-4 hours.    Preterm labor  symptoms and general obstetric precautions including but not limited to vaginal bleeding, contractions, leaking of fluid and fetal movement were reviewed in detail with the patient. Please refer to After Visit Summary for other counseling recommendations.  Return in about 4 weeks (around 03/31/2018).  Nolene BernheimERRI Jordany Russett, RN, MSN, NP-BC Nurse Practitioner, [facility], [facility] 03/03/2018 8:29 PM

## 2018-03-03 NOTE — Patient Instructions (Addendum)
Sign up at [facility] and have breastfeeding classes.  Childbirth Education Options: [facility] Department Classes:  Childbirth education classes can help you get ready for a positive parenting experience. You can also meet other expectant parents and get free stuff for your baby. Each class runs for five weeks on the same night and costs $45 for the mother-to-be and her support person. Medicaid covers the cost if you are eligible. Call [phone] to register. [facility] Childbirth Education:  [phone] or [phone] or [email or web address]  Baby & Me Class: Discuss newborn & infant parenting and family adjustment issues with other new mothers in a relaxed environment. Each week brings a new speaker or baby-centered activity. We encourage new mothers to join [place] every Thursday at 11:00am. Babies birth until crawling. No registration or fee. Daddy [employer]: This course offers Dads-to-be the tools and knowledge needed to feel confident on their journey to becoming new fathers. Experienced dads, who have been trained as coaches, teach dads-to-be how to hold, comfort, diaper, swaddle and play with their infant while being able to support the new mom as well. A class for men taught by men. $25/dad Big Brother/Big Sister: Let your children share in the joy of a new brother or sister in this special class designed just for them. Class includes discussion about how families care for babies: swaddling, holding, diapering, safety as well as how they can be helpful in their new role. This class is designed for children ages 77 to 60, but any age is welcome. Please register each child individually. $5/child  Mom Talk: This mom-led group offers support and connection to mothers as they journey through the adjustments and struggles of that sometimes overwhelming first year after the birth of a child. Tuesdays at 10:00am and Thursdays at 6:00pm. Babies welcome. No registration or fee. Breastfeeding  Support Group: This group is a mother-to-mother support circle where moms have the opportunity to share their breastfeeding experiences. A Lactation Consultant is present for questions and concerns. Meets each Tuesday at 11:00am. No fee or registration. Breastfeeding Your Baby: Learn what to expect in the first days of breastfeeding your newborn.  This class will help you feel more confident with the skills needed to begin your breastfeeding experience. Many new mothers are concerned about breastfeeding after leaving the hospital. This class will also address the most common fears and challenges about breastfeeding during the first few weeks, months and beyond. (call for fee) Comfort Techniques and Tour: This 2 hour interactive class will provide you the opportunity to learn & practice hands-on techniques that can help relieve some of the discomfort of labor and encourage your baby to rotate toward the best position for birth. You and your partner will be able to try a variety of labor positions with birth balls and rebozos as well as practice breathing, relaxation, and visualization techniques. A tour of the [facility] is included with this class. $20 per registrant and support person Childbirth Class- Weekend Option: This class is a Weekend version of our Birth & Baby series. It is designed for parents who have a difficult time fitting several weeks of classes into their schedule. It covers the care of your newborn and the basics of labor and childbirth. It also includes a [place] [facility] and lunch. The class is held two consecutive days: beginning on Friday evening from 6:30 - 8:30 p.m. and the next day, Saturday from 9 a.m. - 4 p.m. (call for  fee) Doren Custard Class: Interested in a waterbirth?  This informational class will help you discover whether waterbirth is the right fit for you. Education about waterbirth itself, supplies you would need and how  to assemble your support team is what you can expect from this class. Some obstetrical practices require this class in order to pursue a waterbirth. (Not all obstetrical practices offer waterbirth-check with your healthcare provider.) Register only the expectant mom, but you are encouraged to bring your partner to class! Required if planning waterbirth, no fee. Infant/Child CPR: Parents, grandparents, babysitters, and friends learn Cardio-Pulmonary Resuscitation skills for infants and children. You will also learn how to treat both conscious and unconscious choking in infants and children. This Family & Friends program does not offer certification. Register each participant individually to ensure that enough mannequins are available. (Call for fee) Grandparent Love: Expecting a grandbaby? This class is for you! Learn about the latest infant care and safety recommendations and ways to support your own child as he or she transitions into the parenting role. Taught by Registered Nurses who are childbirth instructors, but most importantly...they are grandmothers too! $10/person. Childbirth Class- Natural Childbirth: This series of 5 weekly classes is for expectant parents who want to learn and practice natural methods of coping with the process of labor and childbirth. Relaxation, breathing, massage, visualization, role of the partner, and helpful positioning are highlighted. Participants learn how to be confident in their body's ability to give birth. This class will empower and help parents make informed decisions about their own care. Includes discussion that will help new parents transition into the immediate postpartum period. [facility] is included. We suggest taking this class between 25-32 weeks, but it's only a recommendation. $75 per registrant and one support person or $30 Medicaid. Childbirth Class- 3 week Series: This option of 3 weekly classes helps you and your  labor partner prepare for childbirth. Newborn care, labor & birth, cesarean birth, pain management, and comfort techniques are discussed and a [place] [facility] is included. The class meets at the same time, on the same day of the week for 3 consecutive weeks beginning with the starting date you choose. $60 for registrant and one support person.  Marvelous Multiples: Expecting twins, triplets, or more? This class covers the differences in labor, birth, parenting, and breastfeeding issues that face multiples' parents. NICU tour is included. Led by a Certified Childbirth Educator who is the mother of twins. No fee. Caring for Baby: This class is for expectant and adoptive parents who want to learn and practice the most up-to-date newborn care for their babies. Focus is on birth through the first six weeks of life. Topics include feeding, bathing, diapering, crying, umbilical cord care, circumcision care and safe sleep. Parents learn to recognize symptoms of illness and when to call the pediatrician. Register only the mom-to-be and your partner or support person can plan to come with you! $10 per registrant and support person Childbirth Class- online option: This online class offers you the freedom to complete a Birth and Baby series in the comfort of your own home. The flexibility of this option allows you to review sections at your own pace, at times convenient to you and your support people. It includes additional video information, animations, quizzes, and extended activities. Get organized with helpful eClass tools, checklists, and trackers. Once you register online for the class, you will receive an email within a few days to accept  the invitation and begin the class when the time is right for you. The content will be available to you for 60 days. $60 for 60 days of online access for you and your support people.  Local Doulas: Natural Baby  Doulas [email or web address] Tel: [phone] [email or web address] [phone] [email or web address] The Labor Hassell Halim  (also do waterbirth tub rental) [phone] [email or web address] Birth [email or web address] [phone] [email or web address] [place] [email or web address] [employer] (PADA) [email or web address] [place] Birth and Baby  [email or web address] Considering Waterbirth? Guide for patients at [facility] [employer]  Why consider waterbirth?  . Gentle birth for babies . Less pain medicine used in labor . May allow for passive descent/less pushing . May reduce perineal tears  . More mobility and instinctive maternal position changes . Increased maternal relaxation . Reduced blood pressure in labor  Is waterbirth safe? What are the risks of infection, drowning or other complications?  . Infection: o Very low risk (3.7 % for tub vs 4.8% for bed) o 7 in 8000 waterbirths with documented infection o Poorly cleaned equipment most common cause o Slightly lower group B strep transmission rate  . Drowning o Maternal:  - Very low risk   - Related to seizures or fainting o Newborn:  - Very low risk. No evidence of increased risk of respiratory problems in multiple large studies - Physiological protection from breathing under water - Avoid underwater birth if there are any fetal complications - Once baby's head is out of the water, keep it out.  . Birth complication o Some reports of cord trauma, but risk decreased by bringing baby to surface gradually o No evidence of increased risk of shoulder dystocia. Mothers can usually change positions faster in water than in a bed, possibly aiding the maneuvers to free the shoulder.   You  must attend a Doren Custard class at [facility]  3rd Wednesday of every month from 7-9pm  [employer] by calling [phone] or online at [email or web address]  Bring [place] the certificate from the class to your prenatal appointment  Meet with a midwife at 36 weeks to see if you can still plan a waterbirth and to sign the consent.   Purchase or rent the following supplies:   Water Birth Pool (Birth Pool in a Box or Gideon for instance)  (Tubs start ~$125)  Single-use disposable tub liner designed for your brand of tub  New garden hose labeled "lead-free", "suitable for drinking water",  Electric drain pump to remove water (We recommend 792 gallon per hour or greater pump.)   Separate garden hose to remove the dirty water  Fish net  Bathing suit top (optional)  Long-handled mirror (optional)  Places to purchase or rent supplies  GotWebTools.is for tub purchases and supplies  Waterbirthsolutions.com for tub purchases and supplies  The Labor Ladies (www.[email or web address]) $275 for tub rental/set-up & take down/kit   [employer] (http://www.fleming.com/.htm) Information regarding doulas (labor support) who provide pool rentals  Our practice has a Birth Pool in a Box tub at the hospital that you may borrow on a first-come-first-served basis. It is your responsibility to to set up, clean and break down the tub. We cannot guarantee the availability of this tub in advance. You are responsible for bringing all accessories listed above. If you do not have all necessary supplies you cannot have a waterbirth.    Things that would prevent you from having a waterbirth:  Premature, <37wks  Previous cesarean birth  Presence of thick meconium-stained fluid  Multiple gestation (Twins, triplets, etc.)  Uncontrolled diabetes or gestational diabetes requiring medication  Hypertension requiring medication or diagnosis of pre-eclampsia  Heavy  vaginal bleeding  Non-reassuring fetal heart rate  Active infection (MRSA, etc.). Group B Strep is NOT a contraindication for  waterbirth.  If your labor has to be induced and induction method requires continuous  monitoring of the baby's heart rate  Other risks/issues identified by your obstetrical provider  Please remember that birth is unpredictable. Under certain unforeseeable circumstances your provider may advise against giving birth in the tub. These decisions will be made on a case-by-case basis and with the safety of you and your baby as our highest priority.

## 2018-03-23 ENCOUNTER — Ambulatory Visit (HOSPITAL_COMMUNITY)
Admission: RE | Admit: 2018-03-23 | Discharge: 2018-03-23 | Disposition: A | Discharge status: Home / Self Care | Payer: Medicaid Other | Source: Ambulatory Visit | Attending: Obstetrics & Gynecology | Admitting: Obstetrics & Gynecology

## 2018-03-23 DIAGNOSIS — Z3A23 23 weeks gestation of pregnancy: Secondary | ICD-10-CM | POA: Diagnosis not present

## 2018-03-23 DIAGNOSIS — O99212 Obesity complicating pregnancy, second trimester: Secondary | ICD-10-CM | POA: Diagnosis not present

## 2018-03-23 DIAGNOSIS — Z362 Encounter for other antenatal screening follow-up: Secondary | ICD-10-CM | POA: Insufficient documentation

## 2018-03-31 ENCOUNTER — Encounter: Payer: Self-pay | Admitting: Nurse Practitioner

## 2018-03-31 ENCOUNTER — Ambulatory Visit (INDEPENDENT_AMBULATORY_CARE_PROVIDER_SITE_OTHER): Payer: Medicaid Other | Admitting: Nurse Practitioner

## 2018-03-31 VITALS — BP 112/73 | HR 116 | Wt 198.1 lb

## 2018-03-31 DIAGNOSIS — Z3480 Encounter for supervision of other normal pregnancy, unspecified trimester: Secondary | ICD-10-CM

## 2018-03-31 MED ORDER — ALBUTEROL SULFATE HFA 108 (90 BASE) MCG/ACT IN AERS: 2.0000 | INHALATION_SPRAY | Freq: Four times a day (QID) | RESPIRATORY_TRACT | 2 refills | Status: DC | PRN

## 2018-03-31 NOTE — Progress Notes (Addendum)
    Subjective:  Cheryl Henry is a 23 y.o. G3P0020 at 7354w2d being seen today for ongoing prenatal care.  She is currently monitored for the following issues for this low-risk pregnancy and has Acetaminophen overdose; Phenytoin overdose; Mild intermittent asthma; Dysmenorrhea; Supervision of other normal pregnancy, antepartum; and Depression affecting pregnancy, antepartum on their problem list.  Patient reports periodic headaches that she tries warm cloths with and lots of times they resolve.  Will take Tylenol as a last resort but waits to take Tylenol as she tries to stay away from all medicines - "had a problem in my teens and try to stay away from medication"..  Contractions: Not present. Vag. Bleeding: None.  Movement: Present. Denies leaking of fluid.  Also reports having periodic pain in her left upper thigh.  Stabbing pains that resolve when she sits down and massages her upper thigh.    The following portions of the patient's history were reviewed and updated as appropriate: allergies, current medications, past family history, past medical history, past social history, past surgical history and problem list. Problem list updated.  Objective:   Vitals:   03/31/18 1004  BP: 112/73  Pulse: (!) 116  Weight: 198 lb 1.6 oz (89.9 kg)    Fetal Status: Fetal Heart Rate (bpm): 155 Fundal Height: 26 cm Movement: Present     General:  Alert, oriented and cooperative. Patient is in no acute distress.  Skin: Skin is warm and dry. No rash noted.   Cardiovascular: Normal heart rate noted  Respiratory: Normal respiratory effort, no problems with respiration noted  Abdomen: Soft, gravid, appropriate for gestational age. Pain/Pressure: Present     Pelvic:  Cervical exam deferred        Extremities: Normal range of motion.  Edema: None  Mental Status: Normal mood and affect. Normal behavior. Normal judgment and thought content.   Urinalysis:      Assessment and Plan:  Pregnancy: G3P0020 at  6854w2d  1. Supervision of other normal pregnancy, antepartum Is scheduled for breastfeeding class.  Will plan to do tour of new hospital. Glucola will be done at next visit.  Preterm labor symptoms and general obstetric precautions including but not limited to vaginal bleeding, contractions, leaking of fluid and fetal movement were reviewed in detail with the patient. Please refer to After Visit Summary for other counseling recommendations.  Return in about 4 weeks (around 04/28/2018).  Nolene BernheimERRI , RN, MSN, NP-BC Nurse Practitioner, [facility], [facility] 03/31/2018 1:08 PM

## 2018-03-31 NOTE — Patient Instructions (Addendum)
Exercise During Pregnancy For people of all ages, exercise is an important part of being healthy. Exercise improves heart and lung function and helps to maintain strength, flexibility, and a healthy body weight. Exercise also boosts energy levels and elevates mood. For most women, maintaining an exercise routine throughout pregnancy is recommended. It is only on rare occasions and with certain medical conditions or pregnancy complications that women may be asked to limit or avoid exercise during pregnancy. What are some other benefits to exercising during pregnancy? Along with maintaining strength and flexibility, exercising throughout pregnancy can help to:  Keep strength in muscles that are very important during labor and childbirth.  Decrease low back pain during pregnancy.  Decrease the risk of developing gestational diabetes mellitus (GDM).  Improve blood sugar (glucose) control for women who have GDM.  Decrease the risk of developing preeclampsia. This is a serious condition that causes high blood pressure along with other symptoms, such as swelling and headaches.  Decrease the risk of cesarean delivery.  Speed up the recovery after giving birth. How often should I exercise? Unless your health care provider gives you different instructions, you should try to exercise on most days or all days of the week. In general, try to exercise with moderate intensity for about 150 minutes per week. This can be spread out across several days, such as exercising for 30 minutes per day on 5 days of each week. You can tell that you are exercising at a moderate intensity if you have a higher heart rate and faster breathing, but you are still able to hold a conversation. What types of moderate-intensity exercise are recommended during pregnancy? There are many types of exercise that are safe for you to do during pregnancy. Unless your health care provider gives you different instructions, do a variety of  exercises that safely increase your heart and breathing (cardiopulmonary) rates and help you to build and maintain muscle strength (strength training). You should always be able to talk in full sentences while exercising during pregnancy. Some examples of exercising that is safe to do during pregnancy include:  Brisk walking or hiking.  Swimming.  Water aerobics.  Riding a stationary bike.  Strength training.  Modified yoga or Pilates. Tell your instructor that you are pregnant. Avoid overstretching and avoid lying on your back for long periods of time.  Running or jogging. Only choose this type of exercise if: ? You ran or jogged regularly before your pregnancy. ? You can run or jog and still talk in complete sentences. What types of exercise should I not do during pregnancy? Depending on your level of fitness and whether you exercised regularly before your pregnancy, you may be advised to limit vigorous-intensity exercise during your pregnancy. You can tell that you are exercising at a vigorous intensity if you are breathing much harder and faster and cannot hold a conversation while exercising. Some examples of exercising that you should avoid during pregnancy include:  Contact sports.  Activities that place you at risk for falling on or being hit in the belly, such as downhill skiing, water skiing, surfing, rock climbing, cycling, gymnastics, and horseback riding.  Scuba diving.  Sky diving.  Yoga or Pilates in a room that is heated to extreme temperatures ("hot yoga" or "hot Pilates").  Jogging or running, unless you ran or jogged regularly before your pregnancy. While jogging or running, you should always be able to talk in full sentences. Do not run or jog so vigorously that you  are unable to have a conversation.  If you are not used to exercising at elevation (more than 6,000 feet above sea level), do not do so during your pregnancy. When should I avoid exercising during  pregnancy? Certain medical conditions can make it unsafe to exercise during pregnancy, or they may increase your risk of miscarriage or early labor and birth. Some of these conditions include:  Some types of heart disease.  Some types of lung disease.  Placenta previa. This is when the placenta partially or completely covers the opening of the uterus (cervix).  Frequent bleeding from the vagina during your pregnancy.  Incompetent cervix. This is when your cervix does not remain as tightly closed during pregnancy as it should.  Premature labor.  Ruptured membranes. This is when the protective sac (amniotic sac) opens up and amniotic fluid leaks from your vagina.  Severely low blood count (anemia).  Preeclampsia or pregnancy-caused high blood pressure.  Carrying more than one baby (multiple gestation) and having an additional risk of early labor.  Poorly controlled diabetes.  Being severely underweight or severely overweight.  Intrauterine growth restriction. This is when your baby's growth and development during pregnancy are slower than expected.  Other medical conditions. Ask your health care provider if any apply to you. What else should I know about exercising during pregnancy? You should take these precautions while exercising during pregnancy:  Avoid overheating. ? Wear loose-fitting, breathable clothes. ? Do not exercise in very high temperatures.  Avoid dehydration. Drink enough water before, during, and after exercise to keep your urine clear or pale yellow.  Avoid overstretching. Because of hormone changes during pregnancy, it is easy to overstretch muscles, tendons, and ligaments during pregnancy.  Start slowly and ask your health care provider to recommend types of exercise that are safe for you, if exercising regularly is new for you. Pregnancy is not a time for exercising to lose weight. When should I seek medical care? You should stop exercising and call your  health care provider if you have any unusual symptoms, such as:  Mild uterine contractions or abdominal cramping.  Dizziness that does not improve with rest. When should I seek immediate medical care? You should stop exercising and call your local emergency services (911 in the [place] if you have any unusual symptoms, such as:  Sudden, severe pain in your low back or your belly.  Uterine contractions or abdominal cramping that do not improve with rest.  Chest pain.  Bleeding or fluid leaking from your vagina.  Shortness of breath. This information is not intended to replace advice given to you by your health care provider. Make sure you discuss any questions you have with your health care provider. Document Released: 03/03/2005 Document Revised: 08/01/2015 Document Reviewed: 05/11/2014 Elsevier Interactive Patient Education  2019 [place] Education Options: [facility] Department Classes:  Childbirth education classes can help you get ready for a positive parenting experience. You can also meet other expectant parents and get free stuff for your baby. Each class runs for five weeks on the same night and costs $45 for the mother-to-be and her support person. Medicaid covers the cost if you are eligible. Call [phone] to register. [facility] Childbirth Education:  [phone] or [phone] or [email or web address]  Baby & Me Class: Discuss newborn & infant parenting and family adjustment issues with other new mothers in a relaxed environment. Each week brings a new speaker or baby-centered activity. We encourage new mothers to join  [place] every Thursday at 11:00am. Babies birth until 53. No registration or fee. Daddy [employer]: This course offers Dads-to-be the tools and knowledge needed to feel confident on their journey to becoming new fathers. Experienced dads, who have been trained as coaches, teach dads-to-be how to hold, comfort,  diaper, swaddle and play with their infant while being able to support the new mom as well. A class for men taught by men. $25/dad Big Brother/Big Sister: Let your children share in the joy of a new brother or sister in this special class designed just for them. Class includes discussion about how families care for babies: swaddling, holding, diapering, safety as well as how they can be helpful in their new role. This class is designed for children ages 57 to 76, but any age is welcome. Please register each child individually. $5/child  Mom Talk: This mom-led group offers support and connection to mothers as they journey through the adjustments and struggles of that sometimes overwhelming first year after the birth of a child. Tuesdays at 10:00am and Thursdays at 6:00pm. Babies welcome. No registration or fee. Breastfeeding Support Group: This group is a mother-to-mother support circle where moms have the opportunity to share their breastfeeding experiences. A Lactation Consultant is present for questions and concerns. Meets each Tuesday at 11:00am. No fee or registration. Breastfeeding Your Baby: Learn what to expect in the first days of breastfeeding your newborn.  This class will help you feel more confident with the skills needed to begin your breastfeeding experience. Many new mothers are concerned about breastfeeding after leaving the hospital. This class will also address the most common fears and challenges about breastfeeding during the first few weeks, months and beyond. (call for fee) Comfort Techniques and Tour: This 2 hour interactive class will provide you the opportunity to learn & practice hands-on techniques that can help relieve some of the discomfort of labor and encourage your baby to rotate toward the best position for birth. You and your partner will be able to try a variety of labor positions with birth balls and rebozos as well as practice breathing, relaxation, and visualization  techniques. A tour of the [facility] is included with this class. $20 per registrant and support person Childbirth Class- Weekend Option: This class is a Weekend version of our Birth & Baby series. It is designed for parents who have a difficult time fitting several weeks of classes into their schedule. It covers the care of your newborn and the basics of labor and childbirth. It also includes a [place] [facility] and lunch. The class is held two consecutive days: beginning on Friday evening from 6:30 - 8:30 p.m. and the next day, Saturday from 9 a.m. - 4 p.m. (call for fee) Doren Custard Class: Interested in a waterbirth?  This informational class will help you discover whether waterbirth is the right fit for you. Education about waterbirth itself, supplies you would need and how to assemble your support team is what you can expect from this class. Some obstetrical practices require this class in order to pursue a waterbirth. (Not all obstetrical practices offer waterbirth-check with your healthcare provider.) Register only the expectant mom, but you are encouraged to bring your partner to class! Required if planning waterbirth, no fee. Infant/Child CPR: Parents, grandparents, babysitters, and friends learn Cardio-Pulmonary Resuscitation skills for infants and children. You will also learn how to treat both conscious and unconscious choking in infants and children. This Family &  Friends program does not Tree surgeon. Register each participant individually to ensure that enough mannequins are available. (Call for fee) Grandparent Love: Expecting a grandbaby? This class is for you! Learn about the latest infant care and safety recommendations and ways to support your own child as he or she transitions into the parenting role. Taught by Registered Nurses who are childbirth instructors, but most importantly...they are grandmothers too!  $10/person. Childbirth Class- Natural Childbirth: This series of 5 weekly classes is for expectant parents who want to learn and practice natural methods of coping with the process of labor and childbirth. Relaxation, breathing, massage, visualization, role of the partner, and helpful positioning are highlighted. Participants learn how to be confident in their body's ability to give birth. This class will empower and help parents make informed decisions about their own care. Includes discussion that will help new parents transition into the immediate postpartum period. [facility] is included. We suggest taking this class between 25-32 weeks, but it's only a recommendation. $75 per registrant and one support person or $30 Medicaid. Childbirth Class- 3 week Series: This option of 3 weekly classes helps you and your labor partner prepare for childbirth. Newborn care, labor & birth, cesarean birth, pain management, and comfort techniques are discussed and a [place] [facility] is included. The class meets at the same time, on the same day of the week for 3 consecutive weeks beginning with the starting date you choose. $60 for registrant and one support person.  Marvelous Multiples: Expecting twins, triplets, or more? This class covers the differences in labor, birth, parenting, and breastfeeding issues that face multiples' parents. NICU tour is included. Led by a Certified Childbirth Educator who is the mother of twins. No fee. Caring for Baby: This class is for expectant and adoptive parents who want to learn and practice the most up-to-date newborn care for their babies. Focus is on birth through the first six weeks of life. Topics include feeding, bathing, diapering, crying, umbilical cord care, circumcision care and safe sleep. Parents learn to recognize symptoms of illness and when to call the pediatrician. Register only the mom-to-be and your  partner or support person can plan to come with you! $10 per registrant and support person Childbirth Class- online option: This online class offers you the freedom to complete a Birth and Baby series in the comfort of your own home. The flexibility of this option allows you to review sections at your own pace, at times convenient to you and your support people. It includes additional video information, animations, quizzes, and extended activities. Get organized with helpful eClass tools, checklists, and trackers. Once you register online for the class, you will receive an email within a few days to accept the invitation and begin the class when the time is right for you. The content will be available to you for 60 days. $60 for 60 days of online access for you and your support people.  Local Doulas: Natural Baby Doulas [email or web address] Tel: [phone] [email or web address] [employer] [phone] [email or web address] The Labor Hassell Halim  (also do waterbirth tub rental) [phone] [email or web address] [email or web address] Birth Doula [phone] [email or web address] [place] [email or web address] [employer] (PADA) [email or web address] [place] Birth [facility] Baby  [email or web address] Considering Waterbirth? Guide for patients at [facility] [employer]  Why consider waterbirth?  . Gentle birth for babies . Less pain medicine used in labor . May allow for passive descent/less pushing .  May reduce perineal tears  . More mobility and instinctive maternal position changes . Increased maternal relaxation . Reduced blood pressure in labor  Is waterbirth safe? What are the risks of infection, drowning or other complications?  . Infection: o Very low risk (3.7 % for  tub vs 4.8% for bed) o 7 in 8000 waterbirths with documented infection o Poorly cleaned equipment most common cause o Slightly lower group B strep transmission rate  . Drowning o Maternal:  - Very low risk   - Related to seizures or fainting o Newborn:  - Very low risk. No evidence of increased risk of respiratory problems in multiple large studies - Physiological protection from breathing under water - Avoid underwater birth if there are any fetal complications - Once baby's head is out of the water, keep it out.  . Birth complication o Some reports of cord trauma, but risk decreased by bringing baby to surface gradually o No evidence of increased risk of shoulder dystocia. Mothers can usually change positions faster in water than in a bed, possibly aiding the maneuvers to free the shoulder.   You must attend a Doren Custard class at [facility]  3rd Wednesday of every month from 7-9pm  [employer] by calling [phone] or online at [email or web address]  Bring [place] the certificate from the class to your prenatal appointment  Meet with a midwife at 36 weeks to see if you can still plan a waterbirth and to sign the consent.   Purchase or rent the following supplies:   Water Birth Pool (Birth Pool in a Box or [facility] for instance)  (Tubs start ~$125)  Single-use disposable tub liner designed for your brand of tub  New garden hose labeled "lead-free", "suitable for drinking water",  Electric drain pump to remove water (We recommend 792 gallon per hour or greater pump.)   Separate garden hose to remove the dirty water  Fish net  Bathing suit top (optional)  Long-handled mirror (optional)  Places to purchase or rent supplies  GotWebTools.is for tub purchases and supplies  Waterbirthsolutions.com for tub purchases and supplies  The Labor Ladies (www.[email or web address]) $275 for tub rental/set-up & take down/kit   [employer]  (http://www.[place].com/.htm) Information regarding doulas (labor support) who provide pool rentals  Our practice has a Birth Pool in a Box tub at the hospital that you may borrow on a first-come-first-served basis. It is your responsibility to to set up, clean and break down the tub. We cannot guarantee the availability of this tub in advance. You are responsible for bringing all accessories listed above. If you do not have all necessary supplies you cannot have a waterbirth.    Things that would prevent you from having a waterbirth:  Premature, <37wks  Previous cesarean birth  Presence of thick meconium-stained fluid  Multiple gestation (Twins, triplets, etc.)  Uncontrolled diabetes or gestational diabetes requiring medication  Hypertension requiring medication or diagnosis of pre-eclampsia  Heavy vaginal bleeding  Non-reassuring fetal heart rate  Active infection (MRSA, etc.). Group B Strep is NOT a contraindication for  waterbirth.  If your labor has to be induced and induction method requires continuous  monitoring of the baby's heart rate  Other risks/issues identified by your obstetrical provider  Please remember that birth is unpredictable. Under certain unforeseeable circumstances your provider may advise against giving birth in the tub. These decisions will be made on a case-by-case basis and with the safety of you and your baby as our highest priority.

## 2018-04-07 LAB — INFORMED CONSENT NEEDED

## 2018-04-16 ENCOUNTER — Inpatient Hospital Stay (HOSPITAL_COMMUNITY)
Admission: AD | Admit: 2018-04-16 | Discharge: 2018-04-16 | Disposition: A | Discharge status: Home / Self Care | Payer: Medicaid Other | Source: Ambulatory Visit | Attending: Family Medicine | Admitting: Family Medicine

## 2018-04-16 ENCOUNTER — Other Ambulatory Visit: Payer: Self-pay

## 2018-04-16 ENCOUNTER — Encounter (HOSPITAL_COMMUNITY): Payer: Self-pay | Admitting: *Deleted

## 2018-04-16 DIAGNOSIS — O4702 False labor before 37 completed weeks of gestation, second trimester: Secondary | ICD-10-CM | POA: Insufficient documentation

## 2018-04-16 DIAGNOSIS — Z3A26 26 weeks gestation of pregnancy: Secondary | ICD-10-CM | POA: Diagnosis not present

## 2018-04-16 DIAGNOSIS — O99891 Other specified diseases and conditions complicating pregnancy: Secondary | ICD-10-CM

## 2018-04-16 DIAGNOSIS — M549 Dorsalgia, unspecified: Secondary | ICD-10-CM

## 2018-04-16 DIAGNOSIS — O9989 Other specified diseases and conditions complicating pregnancy, childbirth and the puerperium: Secondary | ICD-10-CM

## 2018-04-16 DIAGNOSIS — N949 Unspecified condition associated with female genital organs and menstrual cycle: Secondary | ICD-10-CM | POA: Diagnosis not present

## 2018-04-16 DIAGNOSIS — R102 Pelvic and perineal pain: Secondary | ICD-10-CM | POA: Diagnosis present

## 2018-04-16 DIAGNOSIS — Z87891 Personal history of nicotine dependence: Secondary | ICD-10-CM | POA: Diagnosis not present

## 2018-04-16 DIAGNOSIS — O479 False labor, unspecified: Secondary | ICD-10-CM

## 2018-04-16 LAB — URINALYSIS, ROUTINE W REFLEX MICROSCOPIC
Bilirubin Urine: NEGATIVE
Glucose, UA: NEGATIVE mg/dL
HGB URINE DIPSTICK: NEGATIVE
KETONES UR: NEGATIVE mg/dL
Leukocytes, UA: NEGATIVE
NITRITE: NEGATIVE
PROTEIN: NEGATIVE mg/dL
Specific Gravity, Urine: 1.015 (ref 1.005–1.030)
pH: 8 (ref 5.0–8.0)

## 2018-04-16 MED ORDER — CYCLOBENZAPRINE HCL 10 MG PO TABS: 10.0000 mg | ORAL_TABLET | Freq: Two times a day (BID) | ORAL | 0 refills | Status: DC | PRN

## 2018-04-16 MED ORDER — CYCLOBENZAPRINE HCL 10 MG PO TABS
10.0000 mg | ORAL_TABLET | Freq: Once | ORAL | Status: AC
  Administered 2018-04-16: 10 mg via ORAL
  Filled 2018-04-16: qty 1

## 2018-04-16 NOTE — MAU Provider Note (Signed)
History     CSN: [id number]  Arrival date and time: 04/16/18 1749   First Provider Initiated Contact with Patient 04/16/18 1852      Chief Complaint  Patient presents with  . Pelvic Pain  . Back Pain   Cheryl Henry is a 23 y.o. G3P0020 at [redacted]w[redacted]d who presents today with cramping. She has this off and on for about 2 days. She states that she gets about 2-3 cramps per hour. She denies any VB or LOF. She reports normal fetal movement.   Pelvic Pain  The patient's primary symptoms include pelvic pain. The patient's pertinent negatives include no vaginal discharge. This is a new problem. The current episode started in the past 7 days. The problem occurs intermittently. The problem has been unchanged. Pain severity now: 8/10. The problem affects both sides. She is pregnant. Associated symptoms include back pain and nausea. Pertinent negatives include no chills, constipation, diarrhea, dysuria, fever, frequency or vomiting. The vaginal discharge was normal. There has been no bleeding. Nothing aggravates the symptoms. She has tried nothing for the symptoms. Sexual activity: denies intercourse in the last 24 hours.   Back Pain  Associated symptoms include pelvic pain. Pertinent negatives include no dysuria or fever.    OB History    Gravida  3   Para  0   Term  0   Preterm  0   AB  2   Living  0     SAB  2   TAB  0   Ectopic  0   Multiple  0   Live Births  0           Past Medical History:  Diagnosis Date  . Anxiety   . Asthma    as child   . Depression    doing ok  . Infection    UTI    Past Surgical History:  Procedure Laterality Date  . MYRINGOTOMY WITH TUBE PLACEMENT    . TONSILLECTOMY      Family History  Problem Relation Age of Onset  . Bipolar disorder Sister   . Heart disease Maternal Grandmother        chf  . Diabetes Maternal Grandmother   . Heart disease Maternal Grandfather     Social History   Tobacco Use  . Smoking status: Former  Smoker    Types: Cigarettes  . Smokeless tobacco: Never Used  . Tobacco comment: in HS  Substance Use Topics  . Alcohol use: Not Currently    Comment: occ  . Drug use: No    Allergies:  Allergies  Allergen Reactions  . Prednisone Rash    Medications Prior to Admission  Medication Sig Dispense Refill Last Dose  . acetaminophen (TYLENOL) 325 MG tablet Take 650 mg by mouth every 6 (six) hours as needed.   Taking  . albuterol (PROVENTIL HFA;VENTOLIN HFA) 108 (90 Base) MCG/ACT inhaler Inhale 2 puffs into the lungs every 6 (six) hours as needed for wheezing or shortness of breath. 1 Inhaler 2   . Prenatal Vit-Fe Fumarate-FA (PRENATAL COMPLETE) 14-0.4 MG TABS Take 1 tablet by mouth daily. 60 each 0 Taking    Review of Systems  Constitutional: Negative for chills and fever.  Gastrointestinal: Positive for nausea. Negative for constipation, diarrhea and vomiting.  Genitourinary: Positive for pelvic pain. Negative for dysuria, frequency, vaginal bleeding and vaginal discharge.  Musculoskeletal: Positive for back pain.   Physical Exam   Blood pressure 112/63, pulse (!) 119, temperature 98.4  F (36.9 C), temperature source Oral, resp. rate 20, weight 92.2 kg, last menstrual period 07/21/2017, SpO2 97 %, unknown if currently breastfeeding.  Physical Exam  Nursing note and vitals reviewed. Constitutional: She is oriented to person, place, and time. She appears well-developed and well-nourished. No distress.  HENT:  Head: Normocephalic.  Cardiovascular: Normal rate.  Respiratory: Effort normal.  GI: Soft. There is no abdominal tenderness. There is no rebound.  Genitourinary:    Genitourinary Comments: Cervix: closed/thick/ballotable    Neurological: She is alert and oriented to person, place, and time.  Skin: Skin is warm and dry.  Psychiatric: She has a normal mood and affect.   NST:  Baseline: 150 Variability: moderate Accels: 10x10 Decels: non Toco: none Appropriate for  GA  MAU Course  Procedures  MDM Patient has had flexeril and reports that her pain has improved.   Assessment and Plan   1. Back pain affecting pregnancy in third trimester   2. Round ligament pain   3. Braxton Hicks contractions   4. [redacted] weeks gestation of pregnancy    DC home Comfort measures reviewed  3rd Trimester precautions  PTL precautions  Fetal kick counts RX: flexeril PRN #20  Return to MAU as needed FU with OB as planned  Follow-up Information    [facility] Follow up.   Specialty:  Obstetrics and Gynecology Contact information: [address] [place] [address] [phone]           Thressa Sheller DNP, CNM  04/16/18  6:58 PM

## 2018-04-16 NOTE — MAU Note (Signed)
The last 2 days has had really bad back pains.  Has been dizzy.  At 1100 this morning, the pain woke her up. Braxton hicks have been more painful and and frequent. At one point there was 3 in an hour.

## 2018-04-16 NOTE — Discharge Instructions (Signed)
Braxton Hicks Contractions Contractions of the uterus can occur throughout pregnancy, but they are not always a sign that you are in labor. You may have practice contractions called Braxton Hicks contractions. These false labor contractions are sometimes confused with true labor. What are Braxton Hicks contractions? Braxton Hicks contractions are tightening movements that occur in the muscles of the uterus before labor. Unlike true labor contractions, these contractions do not result in opening (dilation) and thinning of the cervix. Toward the end of pregnancy (32-34 weeks), Braxton Hicks contractions can happen more often and may become stronger. These contractions are sometimes difficult to tell apart from true labor because they can be very uncomfortable. You should not feel embarrassed if you go to the hospital with false labor. Sometimes, the only way to tell if you are in true labor is for your health care provider to look for changes in the cervix. The health care provider will do a physical exam and may monitor your contractions. If you are not in true labor, the exam should show that your cervix is not dilating and your water has not broken. If there are no other health problems associated with your pregnancy, it is completely safe for you to be sent home with false labor. You may continue to have Braxton Hicks contractions until you go into true labor. How to tell the difference between true labor and false labor True labor  Contractions last 30-70 seconds.  Contractions become very regular.  Discomfort is usually felt in the top of the uterus, and it spreads to the lower abdomen and low back.  Contractions do not go away with walking.  Contractions usually become more intense and increase in frequency.  The cervix dilates and gets thinner. False labor  Contractions are usually shorter and not as strong as true labor contractions.  Contractions are usually irregular.  Contractions  are often felt in the front of the lower abdomen and in the groin.  Contractions may go away when you walk around or change positions while lying down.  Contractions get weaker and are shorter-lasting as time goes on.  The cervix usually does not dilate or become thin. Follow these instructions at home:   Take over-the-counter and prescription medicines only as told by your health care provider.  Keep up with your usual exercises and follow other instructions from your health care provider.  Eat and drink lightly if you think you are going into labor.  If Braxton Hicks contractions are making you uncomfortable: ? Change your position from lying down or resting to walking, or change from walking to resting. ? Sit and rest in a tub of warm water. ? Drink enough fluid to keep your urine pale yellow. Dehydration may cause these contractions. ? Do slow and deep breathing several times an hour.  Keep all follow-up prenatal visits as told by your health care provider. This is important. Contact a health care provider if:  You have a fever.  You have continuous pain in your abdomen. Get help right away if:  Your contractions become stronger, more regular, and closer together.  You have fluid leaking or gushing from your vagina.  You pass blood-tinged mucus (bloody show).  You have bleeding from your vagina.  You have low back pain that you never had before.  You feel your baby's head pushing down and causing pelvic pressure.  Your baby is not moving inside you as much as it used to. Summary  Contractions that occur before labor are   called Braxton Hicks contractions, false labor, or practice contractions.  Braxton Hicks contractions are usually shorter, weaker, farther apart, and less regular than true labor contractions. True labor contractions usually become progressively stronger and regular, and they become more frequent.  Manage discomfort from Braxton Hicks contractions  by changing position, resting in a warm bath, drinking plenty of water, or practicing deep breathing. This information is not intended to replace advice given to you by your health care provider. Make sure you discuss any questions you have with your health care provider. Document Released: 07/17/2016 Document Revised: 12/16/2016 Document Reviewed: 07/17/2016 Elsevier Interactive Patient Education  2019 [employer]  

## 2018-04-18 LAB — CULTURE, OB URINE

## 2018-04-23 ENCOUNTER — Encounter (HOSPITAL_COMMUNITY): Payer: Self-pay | Admitting: *Deleted

## 2018-04-23 ENCOUNTER — Inpatient Hospital Stay (HOSPITAL_COMMUNITY)
Admission: AD | Admit: 2018-04-23 | Discharge: 2018-04-24 | Disposition: A | Discharge status: Home / Self Care | Payer: Medicaid Other | Attending: Obstetrics & Gynecology | Admitting: Obstetrics & Gynecology

## 2018-04-23 DIAGNOSIS — Z3A27 27 weeks gestation of pregnancy: Secondary | ICD-10-CM | POA: Diagnosis not present

## 2018-04-23 DIAGNOSIS — O99612 Diseases of the digestive system complicating pregnancy, second trimester: Secondary | ICD-10-CM | POA: Insufficient documentation

## 2018-04-23 DIAGNOSIS — F329 Major depressive disorder, single episode, unspecified: Secondary | ICD-10-CM | POA: Diagnosis not present

## 2018-04-23 DIAGNOSIS — A084 Viral intestinal infection, unspecified: Secondary | ICD-10-CM

## 2018-04-23 DIAGNOSIS — Z87891 Personal history of nicotine dependence: Secondary | ICD-10-CM | POA: Insufficient documentation

## 2018-04-23 DIAGNOSIS — O99342 Other mental disorders complicating pregnancy, second trimester: Secondary | ICD-10-CM | POA: Diagnosis not present

## 2018-04-23 DIAGNOSIS — O26892 Other specified pregnancy related conditions, second trimester: Secondary | ICD-10-CM | POA: Diagnosis not present

## 2018-04-23 DIAGNOSIS — R109 Unspecified abdominal pain: Secondary | ICD-10-CM

## 2018-04-23 DIAGNOSIS — Z3480 Encounter for supervision of other normal pregnancy, unspecified trimester: Secondary | ICD-10-CM

## 2018-04-23 DIAGNOSIS — O99340 Other mental disorders complicating pregnancy, unspecified trimester: Secondary | ICD-10-CM

## 2018-04-23 DIAGNOSIS — O26899 Other specified pregnancy related conditions, unspecified trimester: Secondary | ICD-10-CM

## 2018-04-23 DIAGNOSIS — F32A Depression, unspecified: Secondary | ICD-10-CM

## 2018-04-23 NOTE — MAU Note (Signed)
PT SAYS SHE CAME IN LAST Friday FOR CRAMPS-  AT HOME - CRAMPING SAME.   A LOT OF LOOSE STOOL - STARTED  Thursday .  GETS PNC WITH CLINIC.   LAST SEX-  TODAY

## 2018-04-23 NOTE — MAU Note (Signed)
Pt stated she has had abd pain and cramping for several weeks. Checked out last week and told t was normal.  C/O she started having diarrhea last night. Still having loose stool and noticed some blood in it. C/o increased abd cramping. Reports fetal no movement since 9am.

## 2018-04-24 DIAGNOSIS — O99342 Other mental disorders complicating pregnancy, second trimester: Secondary | ICD-10-CM | POA: Diagnosis not present

## 2018-04-24 DIAGNOSIS — O26892 Other specified pregnancy related conditions, second trimester: Secondary | ICD-10-CM | POA: Diagnosis not present

## 2018-04-24 DIAGNOSIS — R109 Unspecified abdominal pain: Secondary | ICD-10-CM | POA: Diagnosis not present

## 2018-04-24 DIAGNOSIS — Z3A27 27 weeks gestation of pregnancy: Secondary | ICD-10-CM | POA: Diagnosis not present

## 2018-04-24 DIAGNOSIS — F329 Major depressive disorder, single episode, unspecified: Secondary | ICD-10-CM | POA: Diagnosis not present

## 2018-04-24 DIAGNOSIS — A084 Viral intestinal infection, unspecified: Secondary | ICD-10-CM | POA: Diagnosis not present

## 2018-04-24 LAB — URINALYSIS, MICROSCOPIC (REFLEX)

## 2018-04-24 LAB — RAPID URINE DRUG SCREEN, HOSP PERFORMED
AMPHETAMINES: NOT DETECTED
BARBITURATES: NOT DETECTED
BENZODIAZEPINES: NOT DETECTED
Cocaine: NOT DETECTED
OPIATES: NOT DETECTED
Tetrahydrocannabinol: NOT DETECTED

## 2018-04-24 LAB — URINALYSIS, ROUTINE W REFLEX MICROSCOPIC
BILIRUBIN URINE: NEGATIVE
Glucose, UA: NEGATIVE mg/dL
Ketones, ur: NEGATIVE mg/dL
Leukocytes, UA: NEGATIVE
NITRITE: NEGATIVE
PH: 7 (ref 5.0–8.0)
Protein, ur: NEGATIVE mg/dL
SPECIFIC GRAVITY, URINE: 1.015 (ref 1.005–1.030)

## 2018-04-24 LAB — WET PREP, GENITAL
Clue Cells Wet Prep HPF POC: NONE SEEN
SPERM: NONE SEEN
Trich, Wet Prep: NONE SEEN
Yeast Wet Prep HPF POC: NONE SEEN

## 2018-04-24 NOTE — MAU Provider Note (Signed)
Chief Complaint:  Contractions   First Provider Initiated Contact with Patient 04/24/18 0028      HPI: Cheryl Henry is a 23 y.o. G3P0020 at [redacted]w[redacted]d by early ultrasound who presents to maternity admissions reporting abdominal cramping and n/v/d x 24 hours. She reports intermittent cramping for 1 week and was seen in MAU on 1/31 for her symptoms but today her cramping has been more frequent and more painful.  She started having loose stools last night around 11 pm and then cramping abdominal pain and need to have a bowel movement woke her up frequently through the night. Today, she reports nausea but no vomiting and 10+ episodes of loose, not watery, stool.  Her lower abdominal cramping has gradually worsened over the day and is tightening pain lasting 2-15 seconds each time and is irregular. She reports she is drinking and eating normally. There is increased vaginal discharge but no odor/itching/burning. There are no other symptoms. She has not tried any treatments.  She denies sick contacts and does not have fever/chills.     HPI  Past Medical History: Past Medical History:  Diagnosis Date  . Anxiety   . Asthma    as child   . Depression    doing ok  . Infection    UTI    Past obstetric history: OB History  Gravida Para Term Preterm AB Living  3 0 0 0 2 0  SAB TAB Ectopic Multiple Live Births  2 0 0 0 0    # Outcome Date GA Lbr Len/2nd Weight Sex Delivery Anes PTL Lv  3 Current           2 SAB 2013          1 SAB             Past Surgical History: Past Surgical History:  Procedure Laterality Date  . MYRINGOTOMY WITH TUBE PLACEMENT    . TONSILLECTOMY      Family History: Family History  Problem Relation Age of Onset  . Bipolar disorder Sister   . Heart disease Maternal Grandmother        chf  . Diabetes Maternal Grandmother   . Heart disease Maternal Grandfather     Social History: Social History   Tobacco Use  . Smoking status: Former Smoker    Types: Cigarettes   . Smokeless tobacco: Never Used  . Tobacco comment: in HS  Substance Use Topics  . Alcohol use: Not Currently    Comment: occ  . Drug use: No    Allergies:  Allergies  Allergen Reactions  . Prednisone Rash    Meds:  Medications Prior to Admission  Medication Sig Dispense Refill Last Dose  . acetaminophen (TYLENOL) 325 MG tablet Take 650 mg by mouth every 6 (six) hours as needed.   04/22/2018 at Unknown time  . Prenatal Vit-Fe Fumarate-FA (PRENATAL COMPLETE) 14-0.4 MG TABS Take 1 tablet by mouth daily. 60 each 0 04/23/2018 at Unknown time  . albuterol (PROVENTIL HFA;VENTOLIN HFA) 108 (90 Base) MCG/ACT inhaler Inhale 2 puffs into the lungs every 6 (six) hours as needed for wheezing or shortness of breath. 1 Inhaler 2 NONE YET  . cyclobenzaprine (FLEXERIL) 10 MG tablet Take 1 tablet (10 mg total) by mouth 2 (two) times daily as needed for muscle spasms. 20 tablet 0 NONE YET    ROS:  Review of Systems  Constitutional: Negative for chills, fatigue and fever.  Eyes: Negative for visual disturbance.  Respiratory: Negative for  shortness of breath.   Cardiovascular: Negative for chest pain.  Gastrointestinal: Positive for abdominal pain, diarrhea and nausea. Negative for vomiting.  Genitourinary: Positive for pelvic pain. Negative for difficulty urinating, dysuria, flank pain, vaginal bleeding, vaginal discharge and vaginal pain.  Neurological: Negative for dizziness and headaches.  Psychiatric/Behavioral: Negative.      I have reviewed patient's Past Medical Hx, Surgical Hx, Family Hx, Social Hx, medications and allergies.   Physical Exam   Patient Vitals for the past 24 hrs:  BP Temp Pulse Resp Height Weight  04/24/18 0028 - - 100 - - -  04/24/18 0016 - - 93 - - -  04/23/18 2312 127/84 98.9 F (37.2 C) (!) 135 18 5\' 2"  (1.575 m) 93 kg   Constitutional: Well-developed, well-nourished female in no acute distress.  Cardiovascular: normal rate Respiratory: normal effort GI: Abd  soft, non-tender, gravid appropriate for gestational age.  MS: Extremities nontender, no edema, normal ROM Neurologic: Alert and oriented x 4.  GU: Neg CVAT.  PELVIC EXAM: Wet prep/GC collected by blind swab  Dilation: Closed Effacement (%): Thick Cervical Position: Posterior Exam by:: Sharen CounterLisa Leftwich-Kirby, CNM  FHT:  Baseline 145 , moderate variability, accelerations present, no decelerations Contractions: None on toco or to palpation   Labs: Results for orders placed or performed during the hospital encounter of 04/23/18 (from the past 24 hour(s))  Urinalysis, Routine w reflex microscopic     Status: Abnormal   Collection Time: 04/23/18 11:15 PM  Result Value Ref Range   Color, Urine YELLOW YELLOW   APPearance CLEAR CLEAR   Specific Gravity, Urine 1.015 1.005 - 1.030   pH 7.0 5.0 - 8.0   Glucose, UA NEGATIVE NEGATIVE mg/dL   Hgb urine dipstick SMALL (A) NEGATIVE   Bilirubin Urine NEGATIVE NEGATIVE   Ketones, ur NEGATIVE NEGATIVE mg/dL   Protein, ur NEGATIVE NEGATIVE mg/dL   Nitrite NEGATIVE NEGATIVE   Leukocytes, UA NEGATIVE NEGATIVE  Urinalysis, Microscopic (reflex)     Status: Abnormal   Collection Time: 04/23/18 11:15 PM  Result Value Ref Range   RBC / HPF 0-5 0 - 5 RBC/hpf   WBC, UA 0-5 0 - 5 WBC/hpf   Bacteria, UA RARE (A) NONE SEEN   Squamous Epithelial / LPF 0-5 0 - 5  Urine rapid drug screen (hosp performed)     Status: None   Collection Time: 04/23/18 11:23 PM  Result Value Ref Range   Opiates NONE DETECTED NONE DETECTED   Cocaine NONE DETECTED NONE DETECTED   Benzodiazepines NONE DETECTED NONE DETECTED   Amphetamines NONE DETECTED NONE DETECTED   Tetrahydrocannabinol NONE DETECTED NONE DETECTED   Barbiturates NONE DETECTED NONE DETECTED  Wet prep, genital     Status: Abnormal   Collection Time: 04/24/18 12:44 AM  Result Value Ref Range   Yeast Wet Prep HPF POC NONE SEEN NONE SEEN   Trich, Wet Prep NONE SEEN NONE SEEN   Clue Cells Wet Prep HPF POC NONE  SEEN NONE SEEN   WBC, Wet Prep HPF POC FEW (A) NONE SEEN   Sperm NONE SEEN    O/Positive/-- (10/24 [id number])  Imaging:  No results found.  MAU Course/MDM: Orders Placed This Encounter  Procedures  . Wet prep, genital  . Urinalysis, Routine w reflex microscopic  . Urinalysis, Microscopic (reflex)  . Urine rapid drug screen (hosp performed)  . Contact ([place]) / Contact Isolation: Enteric  . Discharge patient    No orders of the defined types were placed in this  encounter.    NST reviewed and appropriate for gestational age Cervix closed/long, no evidence of preterm labor Wet prep wnl, GC pending Likely viral gastroenteritis causing uterine irritability Pt declines imodium in MAU, reports symptoms are improved D/C home with preterm labor precautions Increase PO fluids, drink sports drinks to replace electrolytes after diarrhea Keep scheduled appts in the office Return to MAU with worsening symptoms Pt discharge with strict return precautions.  Assessment: 1. Viral gastroenteritis   2. Supervision of other normal pregnancy, antepartum   3. Depression affecting pregnancy, antepartum   4. Abdominal pain affecting pregnancy     Plan: Discharge home Labor precautions and fetal kick counts  Follow-up Information    [facility].   Specialty:  Obstetrics and Gynecology Why:  As scheduled, return to MAU with worsening symptoms. Contact information: [address] [place] [address] [phone]         Allergies as of 04/24/2018      Reactions   Prednisone Rash      Medication List    TAKE these medications   acetaminophen 325 MG tablet Commonly known as:  TYLENOL Take 650 mg by mouth every 6 (six) hours as needed.   albuterol 108 (90 Base) MCG/ACT inhaler Commonly known as:  PROVENTIL HFA;VENTOLIN HFA Inhale 2 puffs into the lungs every 6 (six) hours as needed for wheezing or shortness of breath.    cyclobenzaprine 10 MG tablet Commonly known as:  FLEXERIL Take 1 tablet (10 mg total) by mouth 2 (two) times daily as needed for muscle spasms.   PRENATAL COMPLETE 14-0.4 MG Tabs Take 1 tablet by mouth daily.       Sharen Counter Certified Nurse-Midwife 04/24/2018 1:46 AM

## 2018-04-26 LAB — GC/CHLAMYDIA PROBE AMP (~~LOC~~) NOT AT ARMC
Chlamydia: NEGATIVE
Neisseria Gonorrhea: NEGATIVE

## 2018-04-28 ENCOUNTER — Encounter: Payer: Self-pay | Admitting: Medical

## 2018-04-28 ENCOUNTER — Other Ambulatory Visit: Payer: Medicaid Other

## 2018-04-28 ENCOUNTER — Ambulatory Visit (INDEPENDENT_AMBULATORY_CARE_PROVIDER_SITE_OTHER): Payer: Medicaid Other | Admitting: Medical

## 2018-04-28 VITALS — BP 101/62 | HR 103 | Wt 204.5 lb

## 2018-04-28 DIAGNOSIS — F329 Major depressive disorder, single episode, unspecified: Secondary | ICD-10-CM

## 2018-04-28 DIAGNOSIS — Z3480 Encounter for supervision of other normal pregnancy, unspecified trimester: Secondary | ICD-10-CM

## 2018-04-28 DIAGNOSIS — J4520 Mild intermittent asthma, uncomplicated: Secondary | ICD-10-CM

## 2018-04-28 DIAGNOSIS — Z3482 Encounter for supervision of other normal pregnancy, second trimester: Secondary | ICD-10-CM

## 2018-04-28 DIAGNOSIS — F32A Depression, unspecified: Secondary | ICD-10-CM

## 2018-04-28 DIAGNOSIS — O99340 Other mental disorders complicating pregnancy, unspecified trimester: Secondary | ICD-10-CM

## 2018-04-28 DIAGNOSIS — O99342 Other mental disorders complicating pregnancy, second trimester: Secondary | ICD-10-CM

## 2018-04-28 DIAGNOSIS — Z3A28 28 weeks gestation of pregnancy: Secondary | ICD-10-CM

## 2018-04-28 NOTE — Patient Instructions (Signed)

## 2018-04-28 NOTE — Progress Notes (Signed)
   PRENATAL VISIT NOTE  Subjective:  Cheryl Henry is a 23 y.o. G3P0020 at [redacted]w[redacted]d being seen today for ongoing prenatal care.  She is currently monitored for the following issues for this low-risk pregnancy and has Acetaminophen overdose; Phenytoin overdose; Mild intermittent asthma; Dysmenorrhea; Supervision of other normal pregnancy, antepartum; and Depression affecting pregnancy, antepartum on their problem list.  Patient reports occasional contractions.  Contractions: Irritability. Vag. Bleeding: None.  Movement: Present. Denies leaking of fluid.   The following portions of the patient's history were reviewed and updated as appropriate: allergies, current medications, past family history, past medical history, past social history, past surgical history and problem list. Problem list updated.  Objective:   Vitals:   04/28/18 0836  BP: 101/62  Pulse: (!) 103  Weight: 204 lb 8 oz (92.8 kg)    Fetal Status: Fetal Heart Rate (bpm): 145 Fundal Height: 29 cm Movement: Present     General:  Alert, oriented and cooperative. Patient is in no acute distress.  Skin: Skin is warm and dry. No rash noted.   Cardiovascular: Normal heart rate noted  Respiratory: Normal respiratory effort, no problems with respiration noted  Abdomen: Soft, gravid, appropriate for gestational age.  Pain/Pressure: Present     Pelvic: Cervical exam deferred        Extremities: Normal range of motion.  Edema: None  Mental Status: Normal mood and affect. Normal behavior. Normal judgment and thought content.   Assessment and Plan:  Pregnancy: G3P0020 at [redacted]w[redacted]d  1. Supervision of other normal pregnancy, antepartum - 2 hour GTT, CBC, HIV and RPR today  - Tdap vaccine greater than or equal to 7yo IM  2. Depression affecting pregnancy, antepartum - No complaints   3. Mild intermittent asthma without complication   Preterm labor symptoms and general obstetric precautions including but not limited to vaginal bleeding,  contractions, leaking of fluid and fetal movement were reviewed in detail with the patient. Please refer to After Visit Summary for other counseling recommendations.  Return in about 2 weeks (around 05/12/2018) for LOB.  Future Appointments  Date Time Provider Department Center  05/11/2018  3:55 PM Marylene Land, CNM [facility]    Vonzella Nipple, PA-C

## 2018-04-29 LAB — CBC
HEMOGLOBIN: 12.8 g/dL (ref 11.1–15.9)
Hematocrit: 38.2 % (ref 34.0–46.6)
MCH: 31.3 pg (ref 26.6–33.0)
MCHC: 33.5 g/dL (ref 31.5–35.7)
MCV: 93 fL (ref 79–97)
Platelets: 258 10*3/uL (ref 150–450)
RBC: 4.09 x10E6/uL (ref 3.77–5.28)
RDW: 12.8 % (ref 11.7–15.4)
WBC: 8.7 10*3/uL (ref 3.4–10.8)

## 2018-04-29 LAB — HIV ANTIBODY (ROUTINE TESTING W REFLEX): HIV Screen 4th Generation wRfx: NONREACTIVE

## 2018-04-29 LAB — GLUCOSE TOLERANCE, 2 HOURS W/ 1HR
Glucose, 1 hour: 160 mg/dL (ref 65–179)
Glucose, 2 hour: 110 mg/dL (ref 65–152)
Glucose, Fasting: 78 mg/dL (ref 65–91)

## 2018-04-29 LAB — RPR: RPR: NONREACTIVE

## 2018-05-03 ENCOUNTER — Telehealth: Payer: Self-pay | Admitting: General Practice

## 2018-05-03 NOTE — Telephone Encounter (Signed)
Patient called and left message on nurse voicemail line requesting glucose test results. Called patient, no answer- unable to leave message as voicemail box was full. Will send mychart message

## 2018-05-11 ENCOUNTER — Other Ambulatory Visit: Payer: Self-pay

## 2018-05-11 ENCOUNTER — Ambulatory Visit (INDEPENDENT_AMBULATORY_CARE_PROVIDER_SITE_OTHER): Payer: Medicaid Other | Admitting: Student

## 2018-05-11 VITALS — BP 111/75 | HR 144 | Wt 205.3 lb

## 2018-05-11 DIAGNOSIS — Z3A30 30 weeks gestation of pregnancy: Secondary | ICD-10-CM

## 2018-05-11 DIAGNOSIS — Z3483 Encounter for supervision of other normal pregnancy, third trimester: Secondary | ICD-10-CM

## 2018-05-11 DIAGNOSIS — Z3480 Encounter for supervision of other normal pregnancy, unspecified trimester: Secondary | ICD-10-CM

## 2018-05-11 NOTE — Progress Notes (Signed)
Pt states baby movements have decreased , mostly feel when eats or laying down.

## 2018-05-11 NOTE — Progress Notes (Signed)
-  does not want pills that could cause breastfeeding issues -unsure abotu IUD, will think about     PRENATAL VISIT NOTE  Subjective:  Cheryl Henry is a 23 y.o. G3P0020 at [redacted]w[redacted]d being seen today for ongoing prenatal care.  She is currently monitored for the following issues for this low-risk pregnancy and has Acetaminophen overdose; Phenytoin overdose; Mild intermittent asthma; Dysmenorrhea; Supervision of other normal pregnancy, antepartum; and Depression affecting pregnancy, antepartum on their problem list.  Patient reports that she has a lot of anxiety about fetal movements. She says that sometimes the baby doesn't move like normal, and she usually moves at night. When questioned, she says that the baby moved "for hours" last night..  Patient states that she is very anxious in this pregnancy about many things; she is "just an anxious person". She also wants to talk about birth control; she does not want anything that will decrease milk supply.  Contractions: Irritability. Vag. Bleeding: None.  Movement: Present. Denies leaking of fluid.   The following portions of the patient's history were reviewed and updated as appropriate: allergies, current medications, past family history, past medical history, past social history, past surgical history and problem list. Problem list updated.  Objective:   Vitals:   05/11/18 1607  BP: 111/75  Pulse: (!) 144  Weight: 205 lb 4.8 oz (93.1 kg)    Fetal Status: Fetal Heart Rate (bpm): 156 Fundal Height: 30 cm Movement: Present     General:  Alert, oriented and cooperative. Patient is in no acute distress.  Skin: Skin is warm and dry. No rash noted.   Cardiovascular: Normal heart rate noted  Respiratory: Normal respiratory effort, no problems with respiration noted  Abdomen: Soft, gravid, appropriate for gestational age.  Pain/Pressure: Present     Pelvic: Cervical exam deferred        Extremities: Normal range of motion.  Edema: Trace  Mental  Status: Normal mood and affect. Normal behavior. Normal judgment and thought content.   Assessment and Plan:  Pregnancy: G3P0020 at [redacted]w[redacted]d  1. Supervision of other normal pregnancy, antepartum -Discussed birth control options with patient; she will think about mini pill versus mirena. She does not want to do Skyla as she had a bad experience when she was a teenager (it was expelled). She does not want anything that could decrease milk supply.  -Reviewed in detail fetal movements, how to monitor and when. Patient states that she feels very relieved to know that baby's tend to be more active at night and that she can do fetal kick counts. I explained that large movements and small movements count; patient relieved as she feels small movements during the day.   Preterm labor symptoms and general obstetric precautions including but not limited to vaginal bleeding, contractions, leaking of fluid and fetal movement were reviewed in detail with the patient. Please refer to After Visit Summary for other counseling recommendations.  Return in about 2 weeks (around 05/25/2018), or LROB.  Future Appointments  Date Time Provider Department Center  05/25/2018  3:35 PM Marylene Land, CNM [facility]    Charlesetta Garibaldi [place], [place]

## 2018-05-11 NOTE — Patient Instructions (Signed)

## 2018-05-25 ENCOUNTER — Ambulatory Visit (INDEPENDENT_AMBULATORY_CARE_PROVIDER_SITE_OTHER): Payer: Medicaid Other | Admitting: Student

## 2018-05-25 ENCOUNTER — Telehealth: Payer: Self-pay | Admitting: Lactation Services

## 2018-05-25 ENCOUNTER — Other Ambulatory Visit: Payer: Self-pay

## 2018-05-25 DIAGNOSIS — Z3483 Encounter for supervision of other normal pregnancy, third trimester: Secondary | ICD-10-CM

## 2018-05-25 DIAGNOSIS — Z3480 Encounter for supervision of other normal pregnancy, unspecified trimester: Secondary | ICD-10-CM

## 2018-05-25 DIAGNOSIS — Z3A32 32 weeks gestation of pregnancy: Secondary | ICD-10-CM

## 2018-05-25 NOTE — Progress Notes (Signed)
   PRENATAL VISIT NOTE  Subjective:  Cheryl Henry is a 23 y.o. G3P0020 at [redacted]w[redacted]d being seen today for ongoing prenatal care.  She is currently monitored for the following issues for this low-risk pregnancy and has Acetaminophen overdose; Phenytoin overdose; Mild intermittent asthma; Dysmenorrhea; Supervision of other normal pregnancy, antepartum; and Depression affecting pregnancy, antepartum on their problem list.  Patient reports that she is still anxious abotu fetal movements. She says that her baby moves less when she is at her boyfriend's house, which she thinks is due to the fact that it is not relaxing over there. When she is at her boyfriends house she is taking care of the dogs and very active, she doesn't feel as relaxed there. She feels her baby move at her moms. She also thinks that her baby is becoming a 'daytime baby" as she feels the baby move more during the day and not at night. She is doing fetal kick counts "whenever I get worried". .  She also wants to know if she can get any diseases from the fleas that her dog has, and if it is safe to take a 5 hour car ride to [place].  Contractions: Irritability. Vag. Bleeding: None.  Movement: Present. Denies leaking of fluid.   The following portions of the patient's history were reviewed and updated as appropriate: allergies, current medications, past family history, past medical history, past social history, past surgical history and problem list.   Objective:   Vitals:   05/25/18 1544  BP: 109/73  Pulse: (!) 131  Weight: 210 lb 8 oz (95.5 kg)    Fetal Status: Fetal Heart Rate (bpm): 163 Fundal Height: 32 cm Movement: Present     General:  Alert, oriented and cooperative. Patient is in no acute distress.  Skin: Skin is warm and dry. No rash noted.   Cardiovascular: Normal heart rate noted  Respiratory: Normal respiratory effort, no problems with respiration noted  Abdomen: Soft, gravid, appropriate for gestational age.  Pain/Pressure:  Present     Pelvic: Cervical exam deferred        Extremities: Normal range of motion.  Edema: Trace  Mental Status: Normal mood and affect. Normal behavior. Normal judgment and thought content.   Assessment and Plan:  Pregnancy: G3P0020 at [redacted]w[redacted]d 1. Supervision of normal pregnancy.  2. Reviewed fetal kick counts again; advised patient to take time at her boyfriends house and do fetal kick counts if she is concerned.  3. Reassured her that she is unlikely to get diseases from fleas.  4. Reassured her that it is fine to take a 5 hour car ride but she should get out and stretch every hour. She agrees to do that.   Preterm labor symptoms and general obstetric precautions including but not limited to vaginal bleeding, contractions, leaking of fluid and fetal movement were reviewed in detail with the patient. Please refer to After Visit Summary for other counseling recommendations.   Return in about 2 weeks (around 06/08/2018), or LROB.  Future Appointments  Date Time Provider Department Center  06/08/2018  2:15 PM Marylene Land, CNM [facility]    Charlesetta Garibaldi [place], [place]

## 2018-05-25 NOTE — Telephone Encounter (Signed)
Spoke with mother in regards to BF her infant. Mom plans to apply for [facility], enc her to take Prenatal BF class either through [facility] or [facility].  Mom is leaking colostrum, informed her this was normal. Mom is hoping to exclusively BF. Reviewed avoiding pacifier and bottles for the first few weeks until BF is well established.   Mom is planning to get a pump from a friend that is unused, she is unsure of type and brand. Discussed getting a manual pump while in the hospital.   Some Prenatal teaching completed today. Mom with great questions about BF.

## 2018-06-08 ENCOUNTER — Other Ambulatory Visit: Payer: Self-pay

## 2018-06-08 ENCOUNTER — Encounter: Payer: Self-pay | Admitting: *Deleted

## 2018-06-08 ENCOUNTER — Ambulatory Visit (INDEPENDENT_AMBULATORY_CARE_PROVIDER_SITE_OTHER): Payer: Medicaid Other | Admitting: Student

## 2018-06-08 DIAGNOSIS — Z3480 Encounter for supervision of other normal pregnancy, unspecified trimester: Secondary | ICD-10-CM

## 2018-06-08 DIAGNOSIS — Z3483 Encounter for supervision of other normal pregnancy, third trimester: Secondary | ICD-10-CM

## 2018-06-08 DIAGNOSIS — Z3A34 34 weeks gestation of pregnancy: Secondary | ICD-10-CM

## 2018-06-08 NOTE — Patient Instructions (Signed)

## 2018-06-08 NOTE — Progress Notes (Signed)
   PRENATAL VISIT NOTE  Subjective:  Cheryl Henry is a 23 y.o. G3P0020 at [redacted]w[redacted]d being seen today for ongoing prenatal care.  She is currently monitored for the following issues for this low-risk pregnancy and has Acetaminophen overdose; Phenytoin overdose; Mild intermittent asthma; Dysmenorrhea; Supervision of other normal pregnancy, antepartum; and Depression affecting pregnancy, antepartum on their problem list.  Patient reports a Braxton hick last night that took her breath away. It happened one time last night at 9:30. . Patient reports strong fetal movements; "not as anxious about movements now".  Contractions: Irritability. Vag. Bleeding: None.  Movement: Present. Denies leaking of fluid.   The following portions of the patient's history were reviewed and updated as appropriate: allergies, current medications, past family history, past medical history, past social history, past surgical history and problem list.   Objective:   Vitals:   06/08/18 1410  BP: 115/78  Pulse: (!) 112  Temp: 98 F (36.7 C)  Weight: 212 lb 6.4 oz (96.3 kg)    Fetal Status: Fetal Heart Rate (bpm): 145 Fundal Height: 34 cm Movement: Present     General:  Alert, oriented and cooperative. Patient is in no acute distress.  Skin: Skin is warm and dry. No rash noted.   Cardiovascular: Normal heart rate noted  Respiratory: Normal respiratory effort, no problems with respiration noted  Abdomen: Soft, gravid, appropriate for gestational age.  Pain/Pressure: Present     Pelvic: Cervical exam deferred        Extremities: Normal range of motion.  Edema: None  Mental Status: Normal mood and affect. Normal behavior. Normal judgment and thought content.   Assessment and Plan:  Pregnancy: G3P0020 at [redacted]w[redacted]d  1. Supervision of other normal pregnancy, antepartum    2. Patient will try to get a blood pressure cuff. We will talk skipping 37 and 39 week visit at 36 week visit.   Preterm labor symptoms and general  obstetric precautions including but not limited to vaginal bleeding, contractions, leaking of fluid and fetal movement were reviewed in detail with the patient. Please refer to After Visit Summary for other counseling recommendations.   Return in about 2 weeks (around 06/22/2018).  No future appointments.  Marylene Land, CNM

## 2018-06-15 ENCOUNTER — Inpatient Hospital Stay (HOSPITAL_COMMUNITY)
Admission: AD | Admit: 2018-06-15 | Discharge: 2018-06-16 | Disposition: A | Discharge status: Home / Self Care | Payer: Medicaid Other | Attending: Obstetrics and Gynecology | Admitting: Obstetrics and Gynecology

## 2018-06-15 ENCOUNTER — Other Ambulatory Visit: Payer: Self-pay

## 2018-06-15 ENCOUNTER — Encounter (HOSPITAL_COMMUNITY): Payer: Self-pay

## 2018-06-15 DIAGNOSIS — Z888 Allergy status to other drugs, medicaments and biological substances status: Secondary | ICD-10-CM | POA: Diagnosis not present

## 2018-06-15 DIAGNOSIS — O4703 False labor before 37 completed weeks of gestation, third trimester: Secondary | ICD-10-CM | POA: Diagnosis not present

## 2018-06-15 DIAGNOSIS — O26893 Other specified pregnancy related conditions, third trimester: Secondary | ICD-10-CM | POA: Diagnosis not present

## 2018-06-15 DIAGNOSIS — Z87891 Personal history of nicotine dependence: Secondary | ICD-10-CM | POA: Diagnosis not present

## 2018-06-15 DIAGNOSIS — Z3A35 35 weeks gestation of pregnancy: Secondary | ICD-10-CM | POA: Insufficient documentation

## 2018-06-15 DIAGNOSIS — O471 False labor at or after 37 completed weeks of gestation: Secondary | ICD-10-CM | POA: Insufficient documentation

## 2018-06-15 DIAGNOSIS — R109 Unspecified abdominal pain: Secondary | ICD-10-CM | POA: Diagnosis not present

## 2018-06-15 LAB — WET PREP, GENITAL
Clue Cells Wet Prep HPF POC: NONE SEEN
Sperm: NONE SEEN
Trich, Wet Prep: NONE SEEN
Yeast Wet Prep HPF POC: NONE SEEN

## 2018-06-15 LAB — URINALYSIS, ROUTINE W REFLEX MICROSCOPIC
Bilirubin Urine: NEGATIVE
GLUCOSE, UA: NEGATIVE mg/dL
HGB URINE DIPSTICK: NEGATIVE
Ketones, ur: NEGATIVE mg/dL
Leukocytes,Ua: NEGATIVE
Nitrite: NEGATIVE
Protein, ur: NEGATIVE mg/dL
Specific Gravity, Urine: 1.01 (ref 1.005–1.030)
pH: 7 (ref 5.0–8.0)

## 2018-06-15 MED ORDER — TERBUTALINE SULFATE 1 MG/ML IJ SOLN
0.2500 mg | Freq: Once | INTRAMUSCULAR | Status: AC
  Administered 2018-06-15: 0.25 mg via SUBCUTANEOUS
  Filled 2018-06-15: qty 1

## 2018-06-15 MED ORDER — LACTATED RINGERS IV BOLUS
1000.0000 mL | Freq: Once | INTRAVENOUS | Status: AC
  Administered 2018-06-15: 1000 mL via INTRAVENOUS

## 2018-06-15 NOTE — MAU Note (Signed)
PT SAYS  AT 0500- WOKE TO SEVERE UC- VOIDED - ALL DAY HAS HAD MORE D/C.  UC'S STOPPED AT 0700.   STARTED AGAIN AT 1115.   BECAME CLOSER AND MORE INTENSE      [facility] .   LAST SEX- Monday AM

## 2018-06-15 NOTE — MAU Provider Note (Addendum)
History     CSN: [id number]  Arrival date and time: 06/15/18 1918   First Provider Initiated Contact with Patient 06/15/18 2004      Chief Complaint  Patient presents with  . Abdominal Pain  . Rupture of Membranes   Cheryl Henry is a 23 y.o. G3P0020 at [redacted]w[redacted]d who presents today with contractions since 0500 today. She states that they were irregular, but since about 1115 am they have been more regular about every 5 mins. She states that around 0500 when she she started having contractions she felt like when she went to urinate that it was more "forceful" than normal and has been leaking since. She reports normal fetal movement.   Abdominal Pain  This is a new problem. The current episode started today. The onset quality is sudden. The problem occurs intermittently. The problem has been unchanged. The pain is located in the suprapubic region. The pain is at a severity of 9/10. The abdominal pain does not radiate. Associated symptoms include nausea. Pertinent negatives include no dysuria, fever, frequency or vomiting. Nothing aggravates the pain. The pain is relieved by nothing. She has tried nothing for the symptoms.    OB History    Gravida  3   Para  0   Term  0   Preterm  0   AB  2   Living  0     SAB  2   TAB  0   Ectopic  0   Multiple  0   Live Births  0           Past Medical History:  Diagnosis Date  . Anxiety   . Asthma    as child   . Depression    doing ok  . Infection    UTI    Past Surgical History:  Procedure Laterality Date  . MYRINGOTOMY WITH TUBE PLACEMENT    . TONSILLECTOMY      Family History  Problem Relation Age of Onset  . Bipolar disorder Sister   . Heart disease Maternal Grandmother        chf  . Diabetes Maternal Grandmother   . Heart disease Maternal Grandfather     Social History   Tobacco Use  . Smoking status: Former Smoker    Types: Cigarettes  . Smokeless tobacco: Never Used  . Tobacco comment: in HS   Substance Use Topics  . Alcohol use: Not Currently    Comment: occ  . Drug use: No    Allergies:  Allergies  Allergen Reactions  . Prednisone Rash    Medications Prior to Admission  Medication Sig Dispense Refill Last Dose  . acetaminophen (TYLENOL) 325 MG tablet Take 650 mg by mouth every 6 (six) hours as needed.   Taking  . albuterol (PROVENTIL HFA;VENTOLIN HFA) 108 (90 Base) MCG/ACT inhaler Inhale 2 puffs into the lungs every 6 (six) hours as needed for wheezing or shortness of breath. (Patient not taking: Reported on 04/28/2018) 1 Inhaler 2 Not Taking  . cyclobenzaprine (FLEXERIL) 10 MG tablet Take 1 tablet (10 mg total) by mouth 2 (two) times daily as needed for muscle spasms. (Patient not taking: Reported on 04/28/2018) 20 tablet 0 Not Taking  . Prenatal Vit-Fe Fumarate-FA (PRENATAL COMPLETE) 14-0.4 MG TABS Take 1 tablet by mouth daily. 60 each 0 Taking    Review of Systems  Constitutional: Negative for chills and fever.  Gastrointestinal: Positive for abdominal pain and nausea. Negative for vomiting.  Genitourinary: Positive for  pelvic pain and vaginal discharge. Negative for dysuria, frequency and vaginal bleeding.   Physical Exam   Blood pressure 118/77, pulse (!) 122, temperature 98.5 F (36.9 C), temperature source Oral, resp. rate 16, height 5' 2.5" (1.588 m), weight 97.3 kg, last menstrual period 07/21/2017, unknown if currently breastfeeding.  Physical Exam  Nursing note and vitals reviewed. Constitutional: She is oriented to person, place, and time. She appears well-developed and well-nourished. No distress.  HENT:  Head: Normocephalic.  Cardiovascular: Normal rate.  Respiratory: Effort normal.  GI: Soft. There is no abdominal tenderness. There is no rebound.  Genitourinary:    Genitourinary Comments:  External: no lesion Vagina: small amount of white discharge. No pooling of fluid  Cervix: pink, smooth, no fluid seen with valsalva. Closed/thick Uterus: AGA    Neurological: She is alert and oriented to person, place, and time.  Skin: Skin is warm and dry.  Psychiatric: She has a normal mood and affect.   Results for orders placed or performed during the hospital encounter of 06/15/18 (from the past 24 hour(s))  Urinalysis, Routine w reflex microscopic     Status: None   Collection Time: 06/15/18  7:46 PM  Result Value Ref Range   Color, Urine YELLOW YELLOW   APPearance CLEAR CLEAR   Specific Gravity, Urine 1.010 1.005 - 1.030   pH 7.0 5.0 - 8.0   Glucose, UA NEGATIVE NEGATIVE mg/dL   Hgb urine dipstick NEGATIVE NEGATIVE   Bilirubin Urine NEGATIVE NEGATIVE   Ketones, ur NEGATIVE NEGATIVE mg/dL   Protein, ur NEGATIVE NEGATIVE mg/dL   Nitrite NEGATIVE NEGATIVE   Leukocytes,Ua NEGATIVE NEGATIVE  Wet prep, genital     Status: Abnormal   Collection Time: 06/15/18  8:19 PM  Result Value Ref Range   Yeast Wet Prep HPF POC NONE SEEN NONE SEEN   Trich, Wet Prep NONE SEEN NONE SEEN   Clue Cells Wet Prep HPF POC NONE SEEN NONE SEEN   WBC, Wet Prep HPF POC MODERATE (A) NONE SEEN   Sperm NONE SEEN     NST:  Baseline: 150 Variability: moderate Accels: 15x15 Decels: none Toco: UI with some occasional contractions  MAU Course  Procedures  MDM LR bolus infusing 8:41 PM care turned over to M. Mayford Knife, CNM Thressa Sheller DNP, CNM  06/15/18  8:41 PM     Assessment and Plan  Assumed care  Still having contractions despite fluids Terbutaline given for tocolysis/comfort Dilation: Closed Effacement (%): 50 Station: -3 Presentation: Vertex Exam by:: Artelia Laroche   UCs lessened considerably after med Preterm labor precautions reviewed Advised re: hydration Encouraged to return here or to other Urgent Care/ED if she develops worsening of symptoms, increase in pain, fever, or other concerning symptoms.    Aviva Signs, CNM

## 2018-06-15 NOTE — Discharge Instructions (Signed)
Preterm Labor and Birth Information °Pregnancy normally lasts 39-41 weeks. Preterm labor is when labor starts early. It starts before you have been pregnant for 37 whole weeks. °What are the risk factors for preterm labor? °Preterm labor is more likely to occur in women who: °· Have an infection while pregnant. °· Have a cervix that is short. °· Have gone into preterm labor before. °· Have had surgery on their cervix. °· Are younger than age 23. °· Are older than age 35. °· Are African American. °· Are pregnant with two or more babies. °· Take street drugs while pregnant. °· Smoke while pregnant. °· Do not gain enough weight while pregnant. °· Got pregnant right after another pregnancy. °What are the symptoms of preterm labor? °Symptoms of preterm labor include: °· Cramps. The cramps may feel like the cramps some women get during their period. The cramps may happen with watery poop (diarrhea). °· Pain in the belly (abdomen). °· Pain in the lower back. °· Regular contractions or tightening. It may feel like your belly is getting tighter. °· Pressure in the lower belly that seems to get stronger. °· More fluid (discharge) leaking from the vagina. The fluid may be watery or bloody. °· Water breaking. °Why is it important to notice signs of preterm labor? °Babies who are born early may not be fully developed. They have a higher chance for: °· Long-term heart problems. °· Long-term lung problems. °· Trouble controlling body systems, like breathing. °· Bleeding in the brain. °· A condition called cerebral palsy. °· Learning difficulties. °· Death. °These risks are highest for babies who are born before 34 weeks of pregnancy. °How is preterm labor treated? °Treatment depends on: °· How long you were pregnant. °· Your condition. °· The health of your baby. °Treatment may involve: °· Having a stitch (suture) placed in your cervix. When you give birth, your cervix opens so the baby can come out. The stitch keeps the cervix  from opening too soon. °· Staying at the hospital. °· Taking or getting medicines, such as: °? Hormone medicines. °? Medicines to stop contractions. °? Medicines to help the baby’s lungs develop. °? Medicines to prevent your baby from having cerebral palsy. °What should I do if I am in preterm labor? °If you think you are going into labor too soon, call your doctor right away. °How can I prevent preterm labor? °· Do not use any tobacco products. °? Examples of these are cigarettes, chewing tobacco, and e-cigarettes. °? If you need help quitting, ask your doctor. °· Do not use street drugs. °· Do not use any medicines unless you ask your doctor if they are safe for you. °· Talk with your doctor before taking any herbal supplements. °· Make sure you gain enough weight. °· Watch for infection. If you think you might have an infection, get it checked right away. °· If you have gone into preterm labor before, tell your doctor. °This information is not intended to replace advice given to you by your health care provider. Make sure you discuss any questions you have with your health care provider. °Document Released: 05/30/2008 Document Revised: 08/14/2015 Document Reviewed: 07/25/2015 °Elsevier Interactive Patient Education © 2019 Elsevier Inc. ° °

## 2018-06-16 LAB — GC/CHLAMYDIA PROBE AMP (~~LOC~~) NOT AT ARMC
Chlamydia: NEGATIVE
Neisseria Gonorrhea: NEGATIVE

## 2018-06-22 ENCOUNTER — Other Ambulatory Visit (HOSPITAL_COMMUNITY)
Admission: RE | Admit: 2018-06-22 | Discharge: 2018-06-22 | Disposition: A | Discharge status: Home / Self Care | Payer: Medicaid Other | Source: Ambulatory Visit | Attending: Student | Admitting: Student

## 2018-06-22 ENCOUNTER — Ambulatory Visit (INDEPENDENT_AMBULATORY_CARE_PROVIDER_SITE_OTHER): Payer: Medicaid Other | Admitting: Student

## 2018-06-22 ENCOUNTER — Encounter: Payer: Self-pay | Admitting: Student

## 2018-06-22 ENCOUNTER — Telehealth: Payer: Self-pay | Admitting: Medical

## 2018-06-22 ENCOUNTER — Other Ambulatory Visit: Payer: Self-pay

## 2018-06-22 VITALS — BP 130/83 | HR 120 | Wt 218.0 lb

## 2018-06-22 DIAGNOSIS — Z3480 Encounter for supervision of other normal pregnancy, unspecified trimester: Secondary | ICD-10-CM | POA: Diagnosis not present

## 2018-06-22 DIAGNOSIS — Z3483 Encounter for supervision of other normal pregnancy, third trimester: Secondary | ICD-10-CM

## 2018-06-22 DIAGNOSIS — Z3A36 36 weeks gestation of pregnancy: Secondary | ICD-10-CM

## 2018-06-22 NOTE — Telephone Encounter (Signed)
Called the patient with upcoming scheduled appointment. Also provided with new address our new location.

## 2018-06-22 NOTE — Patient Instructions (Signed)
Pain Relief During Labor and Delivery  Many things can cause pain during labor and delivery, including:  · Pressure on bones and ligaments due to the baby moving through the pelvis.  · Stretching of tissues due to the baby moving through the birth canal.  · Muscle tension due to anxiety or nervousness.  · The uterus tightening (contracting) and relaxing to help move the baby.  There are many ways to deal with the pain of labor and delivery. They include:  · Taking prenatal classes. Taking these classes helps you know what to expect during your baby’s birth. What you learn will increase your confidence and decrease your anxiety.  · Practicing relaxation techniques or doing relaxing activities, such as:  ? Focused breathing.  ? Meditation.  ? Visualization.  ? Aroma therapy.  ? Listening to your favorite music.  ? Hypnosis.  · Taking a warm shower or bath (hydrotherapy). This may:  ? Provide comfort and relaxation.  ? Lessen your perception of pain.  ? Decrease the amount of pain medicine needed.  ? Decrease the length of labor.  · Getting a massage or counterpressure on your back.  · Applying warm packs or ice packs.  · Changing positions often, moving around, or using a birthing ball.  · Getting:  ? Pain medicine through an IV or injection into a muscle.  ? Pain medicine inserted into your spinal column.  ? Injections of sterile water just under the skin on your lower back (intradermal injections).  ? Laughing gas (nitrous oxide).  Discuss your pain control options with your health care provider during your prenatal visits. Explore the options offered by your hospital or birth center.  What kinds of medicine are available?  There are two kinds of medicines that can be used to relieve pain during labor and delivery:  · Analgesics. These medicines decrease pain without causing you to lose feeling or the ability to move your muscles.  · Anesthetics. These medicines block feeling in the body and can decrease your  ability to move freely.  Both of these kinds of medicine can cause minor side effects, such as nausea, trouble concentrating, and sleepiness. They can also decrease the baby's heart rate before birth and affect the baby’s breathing rate after birth. For this reason, health care providers are careful about when and how much medicine is given.  What are specific medicines and procedures that provide pain relief?  Local Anesthetics  Local anesthetics are used to numb a small area of the body. They may be used along with another kind of anesthetic or used to numb the nerves of the vagina, cervix, and perineum during the second stage of labor.  General Anesthetics  General anesthetics cause you to lose consciousness so you do not feel pain. They are usually only used for an emergency cesarean delivery. General anesthetics are given through an IV tube and a mask.  Pudendal Block  A pudendal block is a form of local anesthetic. It may be used to relieve the pain associated with pushing or stretching of the perineum at the time of delivery or to further numb the perineum. A pudendal block is done by injecting numbing medicine through the vaginal wall into a nerve in the pelvis.  Epidural Analgesia  Epidural analgesia is given through a flexible IV catheter that is inserted into the lower back. Numbing medicine is delivered continuously to the area near your spinal column nerves (epidural space). After having this type of analgesia, you   may be able to move your legs but you most likely will not be able to walk. Depending on the amount of medicine given, you may lose all feeling in the lower half of your body, or you may retain some level of sensation, including the urge to push. Epidural analgesia can be used to provide pain relief for a vaginal birth.  Spinal Block  A spinal block is similar to epidural analgesia, but the medicine is injected into the spinal fluid instead of the epidural space. A spinal block is only given  once. It starts to relieve pain quickly, but the pain relief lasts only 1-6 hours. Spinal blocks can be used for cesarean deliveries.  Combined Spinal-Epidural (CSE) Block  A CSE block combines the effects of a spinal block and epidural analgesia. The spinal block works quickly to block all pain. The epidural analgesia provides continuous pain relief, even after the effects of the spinal block have worn off.  This information is not intended to replace advice given to you by your health care provider. Make sure you discuss any questions you have with your health care provider.  Document Released: 06/19/2008 Document Revised: 08/10/2015 Document Reviewed: 07/25/2015  Elsevier Interactive Patient Education © 2019 Elsevier Inc.

## 2018-06-22 NOTE — Progress Notes (Signed)
   PRENATAL VISIT NOTE  Subjective:  Cheryl Henry is a 23 y.o. G3P0020 at [redacted]w[redacted]d being seen today for ongoing prenatal care.  She is currently monitored for the following issues for this low-risk pregnancy and has Acetaminophen overdose; Phenytoin overdose; Mild intermittent asthma; Dysmenorrhea; Supervision of other normal pregnancy, antepartum; and Depression affecting pregnancy, antepartum on their problem list.  Patient reports still with some contraction. She was seen in MAU on 3-31 and treated for PTL and discharged. .  Contractions: Irregular. Vag. Bleeding: None.  Movement: Present. Denies leaking of fluid.   The following portions of the patient's history were reviewed and updated as appropriate: allergies, current medications, past family history, past medical history, past social history, past surgical history and problem list.   Objective:   Vitals:   06/22/18 0838  BP: 130/83  Pulse: (!) 120  Weight: 218 lb (98.9 kg)    Fetal Status: Fetal Heart Rate (bpm): 145 Fundal Height: 36 cm Movement: Present  Presentation: Vertex  General:  Alert, oriented and cooperative. Patient is in no acute distress.  Skin: Skin is warm and dry. No rash noted.   Cardiovascular: Normal heart rate noted  Respiratory: Normal respiratory effort, no problems with respiration noted  Abdomen: Soft, gravid, appropriate for gestational age.  Pain/Pressure: Present     Pelvic: Cervical exam performed Dilation: Closed Effacement (%): Thick Station: Ballotable  Extremities: Normal range of motion.     Mental Status: Normal mood and affect. Normal behavior. Normal judgment and thought content.   Assessment and Plan:  Pregnancy: G3P0020 at [redacted]w[redacted]d 1. Supervision of other normal pregnancy, antepartum -Patient very anxious about her contractions and delivery; wants guidance on how to manage contractions at home.  -Discussed hydration; discussed with Dr. Debroah Loop if procardia is warranted; at this point, at 36  weeks, it is not recommended.  -Recommended that she return to MAU if her contractions were 5 min apart for one hour lasting for one minute.  -Reassured her that babies born at 36 weeks usually do very well.  - Strep Gp B NAA - Cervicovaginal ancillary only( [place])  Term labor symptoms and general obstetric precautions including but not limited to vaginal bleeding, contractions, leaking of fluid and fetal movement were reviewed in detail with the patient. Please refer to After Visit Summary for other counseling recommendations.   Return in about 1 week (around 06/29/2018), or LROB.  No future appointments.  Marylene Land, CNM

## 2018-06-23 LAB — CERVICOVAGINAL ANCILLARY ONLY
Chlamydia: NEGATIVE
Neisseria Gonorrhea: NEGATIVE

## 2018-06-24 ENCOUNTER — Telehealth: Payer: Self-pay | Admitting: Advanced Practice Midwife

## 2018-06-24 LAB — STREP GP B NAA: Strep Gp B NAA: NEGATIVE

## 2018-06-24 NOTE — Telephone Encounter (Signed)
Called the patient to inform of upcoming appointment. The voicemail box is full and cannot accept messages.

## 2018-06-28 ENCOUNTER — Telehealth: Payer: Self-pay | Admitting: *Deleted

## 2018-06-28 NOTE — Telephone Encounter (Addendum)
Pt left message on 4/9 stating that she missed a call from our office. She was not sure if the call was in regards to test results or an appointment. She would like a call back.   4/14  Pt had appt in office today. Her questions and concerns were answered to her satisfaction.

## 2018-06-29 ENCOUNTER — Other Ambulatory Visit (HOSPITAL_COMMUNITY)
Admission: RE | Admit: 2018-06-29 | Discharge: 2018-06-29 | Disposition: A | Discharge status: Home / Self Care | Payer: Medicaid Other | Source: Ambulatory Visit | Attending: Student | Admitting: Student

## 2018-06-29 ENCOUNTER — Telehealth: Payer: Self-pay | Admitting: Lactation Services

## 2018-06-29 ENCOUNTER — Other Ambulatory Visit: Payer: Self-pay

## 2018-06-29 ENCOUNTER — Ambulatory Visit (INDEPENDENT_AMBULATORY_CARE_PROVIDER_SITE_OTHER): Payer: Medicaid Other | Admitting: Student

## 2018-06-29 VITALS — BP 122/85 | HR 125 | Temp 97.9°F | Wt 217.0 lb

## 2018-06-29 DIAGNOSIS — Z3A37 37 weeks gestation of pregnancy: Secondary | ICD-10-CM

## 2018-06-29 DIAGNOSIS — Z3480 Encounter for supervision of other normal pregnancy, unspecified trimester: Secondary | ICD-10-CM | POA: Diagnosis not present

## 2018-06-29 DIAGNOSIS — Z3483 Encounter for supervision of other normal pregnancy, third trimester: Secondary | ICD-10-CM

## 2018-06-29 NOTE — Progress Notes (Signed)
    PRENATAL VISIT NOTE  Subjective:  Cheryl Henry is a 23 y.o. G3P0020 at [redacted]w[redacted]d being seen today for ongoing prenatal care.  She is currently monitored for the following issues for this low-risk pregnancy and has Acetaminophen overdose; Phenytoin overdose; Mild intermittent asthma; Dysmenorrhea; Supervision of other normal pregnancy, antepartum; and Depression affecting pregnancy, antepartum on their problem list.  Patient reports occasional contractions and some discharge. It is similar to the discharge that she described last week, but a little bit heavier. She does not wear underwear so she does not know if it would fill a panytliner. She also has been sexually active and thinks it may be related to that. Contractions: Irregular. Vag. Bleeding: None.  Movement: Present. Denies leaking of fluid.   The following portions of the patient's history were reviewed and updated as appropriate: allergies, current medications, past family history, past medical history, past social history, past surgical history and problem list.   Objective:   Vitals:   06/29/18 1032  BP: 122/85  Pulse: (!) 125  Temp: 97.9 F (36.6 C)  Weight: 217 lb (98.4 kg)    Fetal Status: Fetal Heart Rate (bpm): 140 Fundal Height: 37 cm Movement: Present  Presentation: Vertex  General:  Alert, oriented and cooperative. Patient is in no acute distress.  Skin: Skin is warm and dry. No rash noted.   Cardiovascular: Normal heart rate noted  Respiratory: Normal respiratory effort, no problems with respiration noted  Abdomen: Soft, gravid, appropriate for gestational age.  Pain/Pressure: Present     Pelvic: Cervical exam performed Dilation: Closed Effacement (%): Thick Station: Ballotable Speculum exam shows milky white, non-odorous discharge in the vagina; no pooling.   Extremities: Normal range of motion.  Edema: Trace  Mental Status: Normal mood and affect. Normal behavior. Normal judgment and thought content.    Assessment and Plan:  Pregnancy: G3P0020 at [redacted]w[redacted]d 1. Supervision of other normal pregnancy, antepartum -Reviewed signs/symptoms of SROM vs. Normal discharge of pregnancy. Reviewed warning signs of pregnancy and when to come to MAU.  -Patient given Dora cuff and will check BP at home; not enrolled in [place] due to late gestation.  - Cervicovaginal ancillary only( [place]) - WET PREP FOR TRICH, YEAST, CLUE  Term labor symptoms and general obstetric precautions including but not limited to vaginal bleeding, contractions, leaking of fluid and fetal movement were reviewed in detail with the patient. Please refer to After Visit Summary for other counseling recommendations.   Return in about 1 week (around 07/06/2018), or Webex visit for LROB.  Future Appointments  Date Time Provider Department Center  07/06/2018  2:35 PM Marylene Land, CNM [facility]    [facility] [place], [place]

## 2018-06-29 NOTE — Telephone Encounter (Signed)
Spoke with pt while here for OB visit. Pt plans to BF her infant.   Pt does not have a pump, she is getting one from her SIL. She reports her SIL has one she did not use, she is not sure she can get it before infant arrives. Discussed she can get a manual pump in the hospital.   Pt informed of IP/OP Lactation Services. Pt reports she has no questions/concerns at this time in regards to BF.

## 2018-06-29 NOTE — Patient Instructions (Signed)
Pain Relief During Labor and Delivery  Many things can cause pain during labor and delivery, including:  · Pressure on bones and ligaments due to the baby moving through the pelvis.  · Stretching of tissues due to the baby moving through the birth canal.  · Muscle tension due to anxiety or nervousness.  · The uterus tightening (contracting) and relaxing to help move the baby.  There are many ways to deal with the pain of labor and delivery. They include:  · Taking prenatal classes. Taking these classes helps you know what to expect during your baby’s birth. What you learn will increase your confidence and decrease your anxiety.  · Practicing relaxation techniques or doing relaxing activities, such as:  ? Focused breathing.  ? Meditation.  ? Visualization.  ? Aroma therapy.  ? Listening to your favorite music.  ? Hypnosis.  · Taking a warm shower or bath (hydrotherapy). This may:  ? Provide comfort and relaxation.  ? Lessen your perception of pain.  ? Decrease the amount of pain medicine needed.  ? Decrease the length of labor.  · Getting a massage or counterpressure on your back.  · Applying warm packs or ice packs.  · Changing positions often, moving around, or using a birthing ball.  · Getting:  ? Pain medicine through an IV or injection into a muscle.  ? Pain medicine inserted into your spinal column.  ? Injections of sterile water just under the skin on your lower back (intradermal injections).  ? Laughing gas (nitrous oxide).  Discuss your pain control options with your health care provider during your prenatal visits. Explore the options offered by your hospital or birth center.  What kinds of medicine are available?  There are two kinds of medicines that can be used to relieve pain during labor and delivery:  · Analgesics. These medicines decrease pain without causing you to lose feeling or the ability to move your muscles.  · Anesthetics. These medicines block feeling in the body and can decrease your  ability to move freely.  Both of these kinds of medicine can cause minor side effects, such as nausea, trouble concentrating, and sleepiness. They can also decrease the baby's heart rate before birth and affect the baby’s breathing rate after birth. For this reason, health care providers are careful about when and how much medicine is given.  What are specific medicines and procedures that provide pain relief?  Local Anesthetics  Local anesthetics are used to numb a small area of the body. They may be used along with another kind of anesthetic or used to numb the nerves of the vagina, cervix, and perineum during the second stage of labor.  General Anesthetics  General anesthetics cause you to lose consciousness so you do not feel pain. They are usually only used for an emergency cesarean delivery. General anesthetics are given through an IV tube and a mask.  Pudendal Block  A pudendal block is a form of local anesthetic. It may be used to relieve the pain associated with pushing or stretching of the perineum at the time of delivery or to further numb the perineum. A pudendal block is done by injecting numbing medicine through the vaginal wall into a nerve in the pelvis.  Epidural Analgesia  Epidural analgesia is given through a flexible IV catheter that is inserted into the lower back. Numbing medicine is delivered continuously to the area near your spinal column nerves (epidural space). After having this type of analgesia, you   may be able to move your legs but you most likely will not be able to walk. Depending on the amount of medicine given, you may lose all feeling in the lower half of your body, or you may retain some level of sensation, including the urge to push. Epidural analgesia can be used to provide pain relief for a vaginal birth.  Spinal Block  A spinal block is similar to epidural analgesia, but the medicine is injected into the spinal fluid instead of the epidural space. A spinal block is only given  once. It starts to relieve pain quickly, but the pain relief lasts only 1-6 hours. Spinal blocks can be used for cesarean deliveries.  Combined Spinal-Epidural (CSE) Block  A CSE block combines the effects of a spinal block and epidural analgesia. The spinal block works quickly to block all pain. The epidural analgesia provides continuous pain relief, even after the effects of the spinal block have worn off.  This information is not intended to replace advice given to you by your health care provider. Make sure you discuss any questions you have with your health care provider.  Document Released: 06/19/2008 Document Revised: 08/10/2015 Document Reviewed: 07/25/2015  Elsevier Interactive Patient Education © 2019 Elsevier Inc.

## 2018-06-30 LAB — CERVICOVAGINAL ANCILLARY ONLY
Bacterial vaginitis: NEGATIVE
Candida vaginitis: NEGATIVE
Chlamydia: NEGATIVE
Neisseria Gonorrhea: NEGATIVE
Trichomonas: NEGATIVE

## 2018-07-06 ENCOUNTER — Ambulatory Visit (INDEPENDENT_AMBULATORY_CARE_PROVIDER_SITE_OTHER): Payer: Medicaid Other | Admitting: Advanced Practice Midwife

## 2018-07-06 ENCOUNTER — Encounter: Payer: Self-pay | Admitting: Student

## 2018-07-06 DIAGNOSIS — Z3483 Encounter for supervision of other normal pregnancy, third trimester: Secondary | ICD-10-CM

## 2018-07-06 DIAGNOSIS — Z3A38 38 weeks gestation of pregnancy: Secondary | ICD-10-CM | POA: Diagnosis not present

## 2018-07-06 DIAGNOSIS — Z3480 Encounter for supervision of other normal pregnancy, unspecified trimester: Secondary | ICD-10-CM

## 2018-07-06 NOTE — Progress Notes (Signed)
   TELEHEALTH VIRTUAL OBSTETRICS VISIT ENCOUNTER NOTE  I connected with Cheryl Henry on 07/06/18 at  9:55 AM EDT by telephone at home and verified that I am speaking with the correct person using two identifiers.   I discussed the limitations, risks, security and privacy concerns of performing an evaluation and management service by telephone and the availability of in person appointments. I also discussed with the patient that there may be a patient responsible charge related to this service. The patient expressed understanding and agreed to proceed.  Subjective:  Cheryl Henry is a 23 y.o. G3P0020 at [redacted]w[redacted]d being followed for ongoing prenatal care.  She is currently monitored for the following issues for this low-risk pregnancy and has Acetaminophen overdose; Phenytoin overdose; Mild intermittent asthma; Dysmenorrhea; Supervision of other normal pregnancy, antepartum; and Depression affecting pregnancy, antepartum on their problem list.  Patient reports no complaints. Reports fetal movement. Denies any contractions, bleeding or leaking of fluid.    Patient reports blood pressure: 121/80, pulse 91  118/81, pulse 100 (patient took it twice).   The following portions of the patient's history were reviewed and updated as appropriate: allergies, current medications, past family history, past medical history, past social history, past surgical history and problem list.   Objective:   General:  Alert, oriented and cooperative.   Mental Status: Normal mood and affect perceived. Normal judgment and thought content.  Rest of physical exam deferred due to type of encounter  Assessment and Plan:  Pregnancy: G3P0020 at [redacted]w[redacted]d 1. Supervision of other normal pregnancy, antepartum - Routine care  Term labor symptoms and general obstetric precautions including but not limited to vaginal bleeding, contractions, leaking of fluid and fetal movement were reviewed in detail with the patient.  I discussed  the assessment and treatment plan with the patient. The patient was provided an opportunity to ask questions and all were answered. The patient agreed with the plan and demonstrated an understanding of the instructions. The patient was advised to call back or seek an in-person office evaluation/go to MAU at [facility] for any urgent or concerning symptoms. Please refer to After Visit Summary for other counseling recommendations.   I provided 15 minutes of non-face-to-face time during this encounter.  Return in about 1 week (around 07/13/2018) for video visit .  No future appointments.  Thressa Sheller DNP, CNM  07/06/18  10:17 AM  Center for [employer], [facility]

## 2018-07-09 ENCOUNTER — Inpatient Hospital Stay (HOSPITAL_COMMUNITY)
Admission: AD | Admit: 2018-07-09 | Discharge: 2018-07-09 | Disposition: A | Discharge status: Home / Self Care | Payer: Medicaid Other | Attending: Obstetrics & Gynecology | Admitting: Obstetrics & Gynecology

## 2018-07-09 ENCOUNTER — Other Ambulatory Visit: Payer: Self-pay

## 2018-07-09 ENCOUNTER — Encounter (HOSPITAL_COMMUNITY): Payer: Self-pay | Admitting: *Deleted

## 2018-07-09 DIAGNOSIS — Z8759 Personal history of other complications of pregnancy, childbirth and the puerperium: Secondary | ICD-10-CM | POA: Diagnosis not present

## 2018-07-09 DIAGNOSIS — Z888 Allergy status to other drugs, medicaments and biological substances status: Secondary | ICD-10-CM | POA: Diagnosis not present

## 2018-07-09 DIAGNOSIS — Z833 Family history of diabetes mellitus: Secondary | ICD-10-CM | POA: Insufficient documentation

## 2018-07-09 DIAGNOSIS — F419 Anxiety disorder, unspecified: Secondary | ICD-10-CM | POA: Insufficient documentation

## 2018-07-09 DIAGNOSIS — F329 Major depressive disorder, single episode, unspecified: Secondary | ICD-10-CM | POA: Insufficient documentation

## 2018-07-09 DIAGNOSIS — Z8669 Personal history of other diseases of the nervous system and sense organs: Secondary | ICD-10-CM

## 2018-07-09 DIAGNOSIS — Z3689 Encounter for other specified antenatal screening: Secondary | ICD-10-CM | POA: Diagnosis not present

## 2018-07-09 DIAGNOSIS — Z818 Family history of other mental and behavioral disorders: Secondary | ICD-10-CM | POA: Insufficient documentation

## 2018-07-09 DIAGNOSIS — O471 False labor at or after 37 completed weeks of gestation: Secondary | ICD-10-CM | POA: Insufficient documentation

## 2018-07-09 DIAGNOSIS — G43909 Migraine, unspecified, not intractable, without status migrainosus: Secondary | ICD-10-CM | POA: Diagnosis present

## 2018-07-09 DIAGNOSIS — Z87891 Personal history of nicotine dependence: Secondary | ICD-10-CM | POA: Diagnosis not present

## 2018-07-09 DIAGNOSIS — O99340 Other mental disorders complicating pregnancy, unspecified trimester: Secondary | ICD-10-CM

## 2018-07-09 DIAGNOSIS — O99353 Diseases of the nervous system complicating pregnancy, third trimester: Secondary | ICD-10-CM | POA: Insufficient documentation

## 2018-07-09 DIAGNOSIS — F32A Depression, unspecified: Secondary | ICD-10-CM

## 2018-07-09 DIAGNOSIS — O99343 Other mental disorders complicating pregnancy, third trimester: Secondary | ICD-10-CM | POA: Insufficient documentation

## 2018-07-09 DIAGNOSIS — Z3A38 38 weeks gestation of pregnancy: Secondary | ICD-10-CM

## 2018-07-09 DIAGNOSIS — O479 False labor, unspecified: Secondary | ICD-10-CM

## 2018-07-09 DIAGNOSIS — Z3480 Encounter for supervision of other normal pregnancy, unspecified trimester: Secondary | ICD-10-CM

## 2018-07-09 LAB — URINALYSIS, ROUTINE W REFLEX MICROSCOPIC
Bilirubin Urine: NEGATIVE
Glucose, UA: NEGATIVE mg/dL
Hgb urine dipstick: NEGATIVE
Ketones, ur: NEGATIVE mg/dL
Leukocytes,Ua: NEGATIVE
Nitrite: NEGATIVE
Protein, ur: NEGATIVE mg/dL
Specific Gravity, Urine: 1.012 (ref 1.005–1.030)
pH: 7 (ref 5.0–8.0)

## 2018-07-09 MED ORDER — METOCLOPRAMIDE HCL 5 MG/ML IJ SOLN
10.0000 mg | Freq: Once | INTRAMUSCULAR | Status: AC
  Administered 2018-07-09: 10 mg via INTRAVENOUS
  Filled 2018-07-09: qty 2

## 2018-07-09 MED ORDER — CYCLOBENZAPRINE HCL 10 MG PO TABS
10.0000 mg | ORAL_TABLET | Freq: Three times a day (TID) | ORAL | Status: DC | PRN
  Administered 2018-07-09: 10 mg via ORAL
  Filled 2018-07-09: qty 1

## 2018-07-09 MED ORDER — CYCLOBENZAPRINE HCL 10 MG PO TABS: 10.0000 mg | ORAL_TABLET | Freq: Three times a day (TID) | ORAL | 0 refills | Status: DC | PRN

## 2018-07-09 MED ORDER — LACTATED RINGERS IV BOLUS
1000.0000 mL | Freq: Once | INTRAVENOUS | Status: AC
  Administered 2018-07-09: 21:00:00 1000 mL via INTRAVENOUS

## 2018-07-09 MED ORDER — DIPHENHYDRAMINE HCL 50 MG/ML IJ SOLN
25.0000 mg | Freq: Once | INTRAMUSCULAR | Status: AC
  Administered 2018-07-09: 25 mg via INTRAVENOUS
  Filled 2018-07-09: qty 1

## 2018-07-09 NOTE — MAU Provider Note (Addendum)
History     CSN: [id number]  Arrival date and time: 07/09/18 1925   First Provider Initiated Contact with Patient 07/09/18 2008      Chief Complaint  Patient presents with  . Contractions  . Headache   G3P0020 @38 .4 wks presenting with migraine HA. Sx started 3 days ago. HA located Temporal and Occipital. Rates pain 8/10. She took Tylenol but it didn't help. Associated sx are nausea and blurry vision. Also reports irregular ctx and LBP. +FM. No VB or LOF.  OB History    Gravida  3   Para  0   Term  0   Preterm  0   AB  2   Living  0     SAB  2   TAB  0   Ectopic  0   Multiple  0   Live Births  0           Past Medical History:  Diagnosis Date  . Anxiety   . Asthma    as child   . Depression    doing ok  . Infection    UTI    Past Surgical History:  Procedure Laterality Date  . MYRINGOTOMY WITH TUBE PLACEMENT    . TONSILLECTOMY      Family History  Problem Relation Age of Onset  . Bipolar disorder Sister   . Heart disease Maternal Grandmother        chf  . Diabetes Maternal Grandmother   . Heart disease Maternal Grandfather     Social History   Tobacco Use  . Smoking status: Former Smoker    Types: Cigarettes  . Smokeless tobacco: Never Used  . Tobacco comment: in HS  Substance Use Topics  . Alcohol use: Not Currently    Comment: occ  . Drug use: No   Allergies:  Allergies  Allergen Reactions  . Prednisone Rash    Medications Prior to Admission  Medication Sig Dispense Refill Last Dose  . acetaminophen (TYLENOL) 325 MG tablet Take 650 mg by mouth every 6 (six) hours as needed.   07/09/2018 at Unknown time  . Prenatal Vit-Fe Fumarate-FA (PRENATAL COMPLETE) 14-0.4 MG TABS Take 1 tablet by mouth daily. 60 each 0 07/09/2018 at Unknown time  . albuterol (PROVENTIL HFA;VENTOLIN HFA) 108 (90 Base) MCG/ACT inhaler Inhale 2 puffs into the lungs every 6 (six) hours as needed for wheezing or shortness of breath. (Patient not taking:  Reported on 04/28/2018) 1 Inhaler 2 More than a month at Unknown time  . cyclobenzaprine (FLEXERIL) 10 MG tablet Take 1 tablet (10 mg total) by mouth 2 (two) times daily as needed for muscle spasms. (Patient not taking: Reported on 04/28/2018) 20 tablet 0 More than a month at Unknown time    Review of Systems  Eyes: Positive for visual disturbance.  Gastrointestinal: Negative for abdominal pain.  Genitourinary: Negative for vaginal bleeding and vaginal discharge.  Neurological: Positive for headaches. Negative for syncope, speech difficulty and weakness.   Physical Exam   Blood pressure 106/64, pulse (!) 106, temperature 98.8 F (37.1 C), resp. rate 18, height 5' 2.5" (1.588 m), weight 99.3 kg, last menstrual period 07/21/2017, SpO2 98 %, unknown if currently breastfeeding. Patient Vitals for the past 24 hrs:  BP Temp Pulse Resp SpO2 Height Weight  07/09/18 2022 106/64 - (!) 106 - - - -  07/09/18 1943 - - - - 98 % - -  07/09/18 1940 109/67 - (!) 119 - - - -  07/09/18  1936 - 98.8 F (37.1 C) - 18 - 5' 2.5" (1.588 m) 99.3 kg    Physical Exam  Nursing note and vitals reviewed. Constitutional: She is oriented to person, place, and time. She appears well-developed and well-nourished. No distress.  HENT:  Head: Normocephalic and atraumatic.  Neck: Normal range of motion.  Respiratory: Effort normal. No respiratory distress.  GI: Soft. She exhibits no distension. There is no abdominal tenderness.  gravid  Genitourinary:    Genitourinary Comments: SVE: closed/thick   Musculoskeletal: Normal range of motion.  Neurological: She is alert and oriented to person, place, and time.  Skin: Skin is warm and dry.  Psychiatric: She has a normal mood and affect.  EFM: 145 bpm, mod variability, + accels, no decels Toco: irregular  Results for orders placed or performed during the hospital encounter of 07/09/18 (from the past 24 hour(s))  Urinalysis, Routine w reflex microscopic     Status: None    Collection Time: 07/09/18  7:56 PM  Result Value Ref Range   Color, Urine YELLOW YELLOW   APPearance CLEAR CLEAR   Specific Gravity, Urine 1.012 1.005 - 1.030   pH 7.0 5.0 - 8.0   Glucose, UA NEGATIVE NEGATIVE mg/dL   Hgb urine dipstick NEGATIVE NEGATIVE   Bilirubin Urine NEGATIVE NEGATIVE   Ketones, ur NEGATIVE NEGATIVE mg/dL   Protein, ur NEGATIVE NEGATIVE mg/dL   Nitrite NEGATIVE NEGATIVE   Leukocytes,Ua NEGATIVE NEGATIVE   MAU Course  Procedures Orders Placed This Encounter  Procedures  . Urinalysis, Routine w reflex microscopic    Standing Status:   Standing    Number of Occurrences:   1   Meds ordered this encounter  Medications  . lactated ringers bolus 1,000 mL  . metoCLOPramide (REGLAN) injection 10 mg  . diphenhydrAMINE (BENADRYL) injection 25 mg  . cyclobenzaprine (FLEXERIL) tablet 10 mg   MDM Transfer of care given to Rosalita Levan, CNM  07/09/2018 9:06 PM   Reassessment after modified HA cocktail due to inability to take decadron. Patient reports HA is completely resolved. NST reactive.   Discussed reasons to return to MAU, labor precautions. Follow up as scheduled for prenatal appointments. Pt stable at time of discharge.   Assessment and Plan   1. Hx of migraine during pregnancy   2. Supervision of other normal pregnancy, antepartum   3. Depression affecting pregnancy, antepartum   4. NST (non-stress test) reactive   5. Braxton Hicks contractions   6. [redacted] weeks gestation of pregnancy    Discharge home  Follow up as scheduled for prenatal appointments Return to MAU as needed Rx refill for flexeril sent to pharmacy   Follow-up Information    Center for [facility] Follow up.   Specialty:  Obstetrics and Gynecology Why:  Follow up as scheduled for prenatal care  Contact information: [address], [address] [id number] mc [place] [address] [phone]         Allergies as  of 07/09/2018      Reactions   Prednisone Rash      Medication List    TAKE these medications   acetaminophen 325 MG tablet Commonly known as:  TYLENOL Take 650 mg by mouth every 6 (six) hours as needed.   albuterol 108 (90 Base) MCG/ACT inhaler Commonly known as:  VENTOLIN HFA Inhale 2 puffs into the lungs every 6 (six) hours as needed for wheezing or shortness of breath.   cyclobenzaprine 10 MG tablet Commonly known  as:  FLEXERIL Take 1 tablet (10 mg total) by mouth 3 (three) times daily as needed for muscle spasms. What changed:  when to take this   Prenatal Complete 14-0.4 MG Tabs Take 1 tablet by mouth daily.      Sharyon CableVeronica C Paytin Ramakrishnan, CNM 07/09/18, 11:28 PM

## 2018-07-09 NOTE — MAU Note (Signed)
Have had a"migraine" for 4 days. Tylenol, warm baths, rest are not helping. Low back pain and ctxs. Denies LOF or bleeding. Some clear dc

## 2018-07-12 ENCOUNTER — Telehealth: Payer: Self-pay | Admitting: *Deleted

## 2018-07-12 NOTE — Telephone Encounter (Signed)
I called Cheryl Henry re: her virtual visit for tomorrow. I verified she has already downloaded [employer] and has already had one virtual visit.

## 2018-07-13 ENCOUNTER — Other Ambulatory Visit: Payer: Self-pay

## 2018-07-13 ENCOUNTER — Encounter: Payer: Self-pay | Admitting: Student

## 2018-07-13 ENCOUNTER — Ambulatory Visit (INDEPENDENT_AMBULATORY_CARE_PROVIDER_SITE_OTHER): Payer: Medicaid Other

## 2018-07-13 VITALS — BP 119/77 | HR 95

## 2018-07-13 DIAGNOSIS — Z8669 Personal history of other diseases of the nervous system and sense organs: Secondary | ICD-10-CM

## 2018-07-13 DIAGNOSIS — Z3480 Encounter for supervision of other normal pregnancy, unspecified trimester: Secondary | ICD-10-CM

## 2018-07-13 DIAGNOSIS — Z3483 Encounter for supervision of other normal pregnancy, third trimester: Secondary | ICD-10-CM

## 2018-07-13 DIAGNOSIS — Z8759 Personal history of other complications of pregnancy, childbirth and the puerperium: Secondary | ICD-10-CM

## 2018-07-13 DIAGNOSIS — Z3A39 39 weeks gestation of pregnancy: Secondary | ICD-10-CM

## 2018-07-13 NOTE — Progress Notes (Signed)
   TELEHEALTH VIRTUAL OBSTETRICS VISIT ENCOUNTER NOTE  I connected with Cheryl Henry on 07/13/18 at  1:35 PM EDT by WebEx at home and verified that I am speaking with the correct person using two identifiers.   I discussed the limitations, risks, security and privacy concerns of performing an evaluation and management service by telephone and the availability of in person appointments. I also discussed with the patient that there may be a patient responsible charge related to this service. The patient expressed understanding and agreed to proceed.  Subjective:  Cheryl Henry is a 23 y.o. G3P0020 at [redacted]w[redacted]d being followed for ongoing prenatal care.  She is currently monitored for the following issues for this low-risk pregnancy and has Acetaminophen overdose; Phenytoin overdose; Mild intermittent asthma; Dysmenorrhea; Supervision of other normal pregnancy, antepartum; and Depression affecting pregnancy, antepartum on their problem list.  Patient reports being tired of being pregnant. Reports fetal movement. Denies any contractions, bleeding or leaking of fluid.   The following portions of the patient's history were reviewed and updated as appropriate: allergies, current medications, past family history, past medical history, past social history, past surgical history and problem list.   Objective:   General:  Alert, oriented and cooperative.   Mental Status: Normal mood and affect perceived. Normal judgment and thought content.  Rest of physical exam deferred due to type of encounter  Assessment and Plan:  Pregnancy: G3P0020 at [redacted]w[redacted]d 1. Supervision of other normal pregnancy, antepartum -BP 119/77 -Patient requesting elective IOL due to being tired of being pregnant. Lengthy discussion of recommendations regarding labor induction and her unfavorable cervix. Patient agreeable to cervical exam at next visit to discuss IOL. Reviewed IOL options at home including red raspberry leaf tea and EPO.  Patient with many questions about castor oil and reviewed at length.  -Reviewed labor precautions and requirement for admission to labor and delivery. Reviewed visitation rules due to COVID 19 -IOL scheduled 5/11 at 0730, orders placed -Next visit in person for postdates testing.  2. Hx of migraine during pregnancy -Patient did not pick up flexeril but reports no more headaches  Term labor symptoms and general obstetric precautions including but not limited to vaginal bleeding, contractions, leaking of fluid and fetal movement were reviewed in detail with the patient.  I discussed the assessment and treatment plan with the patient. The patient was provided an opportunity to ask questions and all were answered. The patient agreed with the plan and demonstrated an understanding of the instructions. The patient was advised to call back or seek an in-person office evaluation/go to MAU at [facility] for any urgent or concerning symptoms. Please refer to After Visit Summary for other counseling recommendations.   I provided 25 minutes of non-face-to-face time during this encounter.  Return in about 1 week (around 07/20/2018) for Return OB visit.  Future Appointments  Date Time Provider Department Center  07/26/2018  7:30 AM [facility] SCHED ROOM [facility] None    Rolm Bookbinder, CNM Center for [employer], [facility]

## 2018-07-13 NOTE — Progress Notes (Signed)
I connected with  Cheryl Henry on 07/13/18 at  1:35 PM EDT by telephone and verified that I am speaking with the correct person using two identifiers.   I discussed the limitations, risks, security and privacy concerns of performing an evaluation and management service by telephone and the availability of in person appointments. I also discussed with the patient that there may be a patient responsible charge related to this service. The patient expressed understanding and agreed to proceed.  Janene Madeira Demetress Tift, CMA 07/13/2018  1:16 PM

## 2018-07-14 ENCOUNTER — Encounter (HOSPITAL_COMMUNITY): Payer: Self-pay | Admitting: *Deleted

## 2018-07-14 ENCOUNTER — Telehealth (HOSPITAL_COMMUNITY): Payer: Self-pay | Admitting: *Deleted

## 2018-07-14 NOTE — Telephone Encounter (Signed)
Preadmission screen  

## 2018-07-19 ENCOUNTER — Other Ambulatory Visit: Payer: Self-pay | Admitting: Advanced Practice Midwife

## 2018-07-19 ENCOUNTER — Telehealth (HOSPITAL_COMMUNITY): Payer: Self-pay | Admitting: *Deleted

## 2018-07-19 NOTE — Telephone Encounter (Signed)
covid appt 

## 2018-07-20 ENCOUNTER — Telehealth: Payer: Self-pay | Admitting: Advanced Practice Midwife

## 2018-07-20 NOTE — Telephone Encounter (Signed)
Called the patient to inform of new location and date and time of appointment. The patient verbalized understanding. °

## 2018-07-21 ENCOUNTER — Encounter: Payer: Self-pay | Admitting: Obstetrics and Gynecology

## 2018-07-21 ENCOUNTER — Ambulatory Visit: Payer: Self-pay

## 2018-07-21 ENCOUNTER — Other Ambulatory Visit: Payer: Self-pay

## 2018-07-21 ENCOUNTER — Ambulatory Visit (INDEPENDENT_AMBULATORY_CARE_PROVIDER_SITE_OTHER): Payer: Medicaid Other | Admitting: Obstetrics and Gynecology

## 2018-07-21 ENCOUNTER — Telehealth: Payer: Self-pay | Admitting: Obstetrics and Gynecology

## 2018-07-21 ENCOUNTER — Ambulatory Visit (INDEPENDENT_AMBULATORY_CARE_PROVIDER_SITE_OTHER): Payer: Medicaid Other | Admitting: *Deleted

## 2018-07-21 VITALS — BP 137/78 | HR 104 | Temp 98.5°F | Wt 218.9 lb

## 2018-07-21 DIAGNOSIS — Z3A40 40 weeks gestation of pregnancy: Secondary | ICD-10-CM

## 2018-07-21 DIAGNOSIS — O480 Post-term pregnancy: Secondary | ICD-10-CM

## 2018-07-21 DIAGNOSIS — Z3483 Encounter for supervision of other normal pregnancy, third trimester: Secondary | ICD-10-CM

## 2018-07-21 DIAGNOSIS — Z3480 Encounter for supervision of other normal pregnancy, unspecified trimester: Secondary | ICD-10-CM

## 2018-07-21 NOTE — Progress Notes (Signed)

## 2018-07-21 NOTE — Telephone Encounter (Signed)
Informed the patient of scheduling the postpartum after she delivers to better place her with the doctor that delivers her. The patient verbalized understanding.

## 2018-07-21 NOTE — Progress Notes (Signed)
   PRENATAL VISIT NOTE  Subjective:  Cheryl Henry is a 23 y.o. G3P0020 at [redacted]w[redacted]d being seen today for ongoing prenatal care.  She is currently monitored for the following issues for this low-risk pregnancy and has Acetaminophen overdose; Phenytoin overdose; Mild intermittent asthma; Dysmenorrhea; Supervision of other normal pregnancy, antepartum; and Depression affecting pregnancy, antepartum on their problem list.  Patient reports no complaints.  Contractions: Irregular. Vag. Bleeding: None.  Movement: Present. Denies leaking of fluid.   The following portions of the patient's history were reviewed and updated as appropriate: allergies, current medications, past family history, past medical history, past social history, past surgical history and problem list.   Objective:   Vitals:   07/21/18 1024  BP: 137/78  Pulse: (!) 104  Temp: 98.5 F (36.9 C)  Weight: 218 lb 14.4 oz (99.3 kg)   Fetal Status: Fetal Heart Rate (bpm): 134   Movement: Present     General:  Alert, oriented and cooperative. Patient is in no acute distress.  Skin: Skin is warm and dry. No rash noted.   Cardiovascular: Normal heart rate noted  Respiratory: Normal respiratory effort, no problems with respiration noted  Abdomen: Soft, gravid, appropriate for gestational age.  Pain/Pressure: Present     Pelvic: Cervical exam performed      1/50/-2  Extremities: Normal range of motion.  Edema: Trace  Mental Status: Normal mood and affect. Normal behavior. Normal judgment and thought content.   Assessment and Plan:  Pregnancy: G3P0020 at [redacted]w[redacted]d  1. Supervision of other normal pregnancy, antepartum Has BPP after visit today IOL scheduled for 41 weeks Declines contraception  Term labor symptoms and general obstetric precautions including but not limited to vaginal bleeding, contractions, leaking of fluid and fetal movement were reviewed in detail with the patient. Please refer to After Visit Summary for other counseling  recommendations.   Return in about 5 weeks (around 08/25/2018) for post partum check.  Future Appointments  Date Time Provider Department Center  07/21/2018 11:15 AM Day, Drucilla Schmidt, RN [facility]  07/22/2018  8:20 AM [facility]U 1 [facility] None  07/26/2018  7:30 AM [facility]-LD SCHED ROOM [facility] None    Conan Bowens, MD

## 2018-07-22 ENCOUNTER — Other Ambulatory Visit: Payer: Self-pay

## 2018-07-22 ENCOUNTER — Other Ambulatory Visit (HOSPITAL_COMMUNITY)
Admission: RE | Admit: 2018-07-22 | Discharge: 2018-07-22 | Disposition: A | Discharge status: Home / Self Care | Payer: Medicaid Other | Source: Ambulatory Visit | Attending: Obstetrics & Gynecology | Admitting: Obstetrics & Gynecology

## 2018-07-22 NOTE — MAU Note (Signed)
Covid screening complete, pt tolerated well, pt asymptomatic

## 2018-07-23 LAB — NOVEL CORONAVIRUS, NAA (HOSP ORDER, SEND-OUT TO REF LAB; TAT 18-24 HRS): SARS-CoV-2, NAA: NOT DETECTED

## 2018-07-24 ENCOUNTER — Other Ambulatory Visit: Payer: Self-pay

## 2018-07-24 ENCOUNTER — Inpatient Hospital Stay (HOSPITAL_COMMUNITY): Payer: Medicaid Other | Admitting: Anesthesiology

## 2018-07-24 ENCOUNTER — Encounter (HOSPITAL_COMMUNITY): Payer: Self-pay | Admitting: *Deleted

## 2018-07-24 ENCOUNTER — Inpatient Hospital Stay (HOSPITAL_COMMUNITY)
Admission: AD | Admit: 2018-07-24 | Discharge: 2018-07-28 | DRG: 786 | Disposition: A | Discharge status: Home / Self Care | Payer: Medicaid Other | Attending: Obstetrics and Gynecology | Admitting: Obstetrics and Gynecology

## 2018-07-24 DIAGNOSIS — O99214 Obesity complicating childbirth: Secondary | ICD-10-CM | POA: Diagnosis present

## 2018-07-24 DIAGNOSIS — Z3A00 Weeks of gestation of pregnancy not specified: Secondary | ICD-10-CM | POA: Diagnosis not present

## 2018-07-24 DIAGNOSIS — O480 Post-term pregnancy: Secondary | ICD-10-CM | POA: Diagnosis not present

## 2018-07-24 DIAGNOSIS — Z3A40 40 weeks gestation of pregnancy: Secondary | ICD-10-CM

## 2018-07-24 DIAGNOSIS — O26893 Other specified pregnancy related conditions, third trimester: Secondary | ICD-10-CM | POA: Diagnosis present

## 2018-07-24 DIAGNOSIS — Z3480 Encounter for supervision of other normal pregnancy, unspecified trimester: Secondary | ICD-10-CM

## 2018-07-24 DIAGNOSIS — Z362 Encounter for other antenatal screening follow-up: Secondary | ICD-10-CM | POA: Diagnosis not present

## 2018-07-24 DIAGNOSIS — Z1159 Encounter for screening for other viral diseases: Secondary | ICD-10-CM | POA: Diagnosis not present

## 2018-07-24 DIAGNOSIS — F329 Major depressive disorder, single episode, unspecified: Secondary | ICD-10-CM | POA: Diagnosis present

## 2018-07-24 DIAGNOSIS — O9952 Diseases of the respiratory system complicating childbirth: Secondary | ICD-10-CM | POA: Diagnosis present

## 2018-07-24 DIAGNOSIS — O411290 Chorioamnionitis, unspecified trimester, not applicable or unspecified: Secondary | ICD-10-CM | POA: Diagnosis not present

## 2018-07-24 DIAGNOSIS — Z3689 Encounter for other specified antenatal screening: Secondary | ICD-10-CM

## 2018-07-24 DIAGNOSIS — O4292 Full-term premature rupture of membranes, unspecified as to length of time between rupture and onset of labor: Principal | ICD-10-CM | POA: Diagnosis present

## 2018-07-24 DIAGNOSIS — J45909 Unspecified asthma, uncomplicated: Secondary | ICD-10-CM | POA: Diagnosis present

## 2018-07-24 DIAGNOSIS — E669 Obesity, unspecified: Secondary | ICD-10-CM | POA: Diagnosis present

## 2018-07-24 DIAGNOSIS — O411230 Chorioamnionitis, third trimester, not applicable or unspecified: Secondary | ICD-10-CM | POA: Diagnosis present

## 2018-07-24 DIAGNOSIS — Z87891 Personal history of nicotine dependence: Secondary | ICD-10-CM

## 2018-07-24 DIAGNOSIS — Z3490 Encounter for supervision of normal pregnancy, unspecified, unspecified trimester: Secondary | ICD-10-CM | POA: Diagnosis present

## 2018-07-24 DIAGNOSIS — F32A Depression, unspecified: Secondary | ICD-10-CM

## 2018-07-24 DIAGNOSIS — O99340 Other mental disorders complicating pregnancy, unspecified trimester: Secondary | ICD-10-CM | POA: Diagnosis present

## 2018-07-24 DIAGNOSIS — O4290 Premature rupture of membranes, unspecified as to length of time between rupture and onset of labor, unspecified weeks of gestation: Secondary | ICD-10-CM

## 2018-07-24 LAB — CBC
HCT: 38.4 % (ref 36.0–46.0)
Hemoglobin: 12.8 g/dL (ref 12.0–15.0)
MCH: 29.9 pg (ref 26.0–34.0)
MCHC: 33.3 g/dL (ref 30.0–36.0)
MCV: 89.7 fL (ref 80.0–100.0)
Platelets: 230 10*3/uL (ref 150–400)
RBC: 4.28 MIL/uL (ref 3.87–5.11)
RDW: 13.9 % (ref 11.5–15.5)
WBC: 9.6 10*3/uL (ref 4.0–10.5)
nRBC: 0 % (ref 0.0–0.2)

## 2018-07-24 LAB — TYPE AND SCREEN
ABO/RH(D): O POS
Antibody Screen: NEGATIVE

## 2018-07-24 LAB — ABO/RH: ABO/RH(D): O POS

## 2018-07-24 MED ORDER — LACTATED RINGERS IV SOLN: 500.0000 mL | Freq: Once | INTRAVENOUS | Status: DC

## 2018-07-24 MED ORDER — SODIUM CHLORIDE (PF) 0.9 % IJ SOLN
INTRAMUSCULAR | Status: DC | PRN
  Administered 2018-07-24: 12 mL/h via EPIDURAL

## 2018-07-24 MED ORDER — SOD CITRATE-CITRIC ACID 500-334 MG/5ML PO SOLN
30.0000 mL | ORAL | Status: DC | PRN
  Administered 2018-07-25: 18:00:00 30 mL via ORAL
  Filled 2018-07-24 (×2): qty 30

## 2018-07-24 MED ORDER — OXYTOCIN 40 UNITS IN NORMAL SALINE INFUSION - SIMPLE MED: 2.5000 [IU]/h | INTRAVENOUS | Status: DC

## 2018-07-24 MED ORDER — OXYCODONE-ACETAMINOPHEN 5-325 MG PO TABS: 1.0000 | ORAL_TABLET | ORAL | Status: DC | PRN

## 2018-07-24 MED ORDER — OXYTOCIN 40 UNITS IN NORMAL SALINE INFUSION - SIMPLE MED
1.0000 m[IU]/min | INTRAVENOUS | Status: DC
  Administered 2018-07-24: 22:00:00 1 m[IU]/min via INTRAVENOUS
  Filled 2018-07-24: qty 1000

## 2018-07-24 MED ORDER — DIPHENHYDRAMINE HCL 50 MG/ML IJ SOLN
12.5000 mg | INTRAMUSCULAR | Status: DC | PRN
  Administered 2018-07-24: 12.5 mg via INTRAVENOUS
  Filled 2018-07-24: qty 1

## 2018-07-24 MED ORDER — PHENYLEPHRINE 40 MCG/ML (10ML) SYRINGE FOR IV PUSH (FOR BLOOD PRESSURE SUPPORT)
80.0000 ug | PREFILLED_SYRINGE | INTRAVENOUS | Status: AC | PRN
  Administered 2018-07-25 (×3): 80 ug via INTRAVENOUS
  Filled 2018-07-24: qty 10

## 2018-07-24 MED ORDER — PHENYLEPHRINE 40 MCG/ML (10ML) SYRINGE FOR IV PUSH (FOR BLOOD PRESSURE SUPPORT)
80.0000 ug | PREFILLED_SYRINGE | INTRAVENOUS | Status: DC | PRN
  Filled 2018-07-24 (×2): qty 10

## 2018-07-24 MED ORDER — LACTATED RINGERS IV SOLN
500.0000 mL | Freq: Once | INTRAVENOUS | Status: AC
  Administered 2018-07-24: 17:00:00 500 mL via INTRAVENOUS

## 2018-07-24 MED ORDER — LACTATED RINGERS IV SOLN
500.0000 mL | INTRAVENOUS | Status: DC | PRN
  Administered 2018-07-25 (×5): 500 mL via INTRAVENOUS

## 2018-07-24 MED ORDER — EPHEDRINE 5 MG/ML INJ: 10.0000 mg | INTRAVENOUS | Status: DC | PRN

## 2018-07-24 MED ORDER — ACETAMINOPHEN 325 MG PO TABS: 650.0000 mg | ORAL_TABLET | ORAL | Status: DC | PRN

## 2018-07-24 MED ORDER — FLEET ENEMA 7-19 GM/118ML RE ENEM: 1.0000 | ENEMA | RECTAL | Status: DC | PRN

## 2018-07-24 MED ORDER — TERBUTALINE SULFATE 1 MG/ML IJ SOLN: 0.2500 mg | Freq: Once | INTRAMUSCULAR | Status: DC | PRN

## 2018-07-24 MED ORDER — ONDANSETRON HCL 4 MG/2ML IJ SOLN
4.0000 mg | Freq: Four times a day (QID) | INTRAMUSCULAR | Status: DC | PRN
  Administered 2018-07-25: 4 mg via INTRAVENOUS
  Filled 2018-07-24: qty 2

## 2018-07-24 MED ORDER — LIDOCAINE HCL (PF) 1 % IJ SOLN
INTRAMUSCULAR | Status: DC | PRN
  Administered 2018-07-24 (×2): 4 mL via EPIDURAL

## 2018-07-24 MED ORDER — LACTATED RINGERS IV SOLN
INTRAVENOUS | Status: DC
  Administered 2018-07-24 – 2018-07-25 (×7): via INTRAVENOUS

## 2018-07-24 MED ORDER — LIDOCAINE HCL (PF) 1 % IJ SOLN: 30.0000 mL | INTRAMUSCULAR | Status: DC | PRN

## 2018-07-24 MED ORDER — OXYTOCIN BOLUS FROM INFUSION: 500.0000 mL | Freq: Once | INTRAVENOUS | Status: DC

## 2018-07-24 MED ORDER — FENTANYL-BUPIVACAINE-NACL 0.5-0.125-0.9 MG/250ML-% EP SOLN
12.0000 mL/h | EPIDURAL | Status: DC | PRN
  Administered 2018-07-25: 08:00:00 12 mL/h via EPIDURAL
  Filled 2018-07-24 (×3): qty 250

## 2018-07-24 MED ORDER — OXYCODONE-ACETAMINOPHEN 5-325 MG PO TABS: 2.0000 | ORAL_TABLET | ORAL | Status: DC | PRN

## 2018-07-24 MED ORDER — FENTANYL CITRATE (PF) 100 MCG/2ML IJ SOLN
100.0000 ug | INTRAMUSCULAR | Status: DC | PRN
  Administered 2018-07-24: 100 ug via INTRAVENOUS
  Filled 2018-07-24 (×2): qty 2

## 2018-07-24 NOTE — Plan of Care (Signed)

## 2018-07-24 NOTE — Progress Notes (Signed)
Vitals:   07/24/18 2101 07/24/18 2130  BP: 110/71 121/60  Pulse: 90 (!) 106  Resp: 18 18  Temp:    SpO2:     Comfortable w/epidural. Foley fell out around 1900. Cx still 4-5/50/-2.  IUPC placed and MVUs ~ .  FHR Cat 1. Recommended Pitocin augmentation, pt agreeable.  Wishes to do 23mu/min increases.

## 2018-07-24 NOTE — Anesthesia Procedure Notes (Signed)
Epidural Patient location during procedure: OB Start time: 07/24/2018 4:59 PM End time: 07/24/2018 5:02 PM  Staffing Anesthesiologist: Kaylyn Layer, MD Performed: anesthesiologist   Preanesthetic Checklist Completed: patient identified, pre-op evaluation, timeout performed, IV checked, risks and benefits discussed and monitors and equipment checked  Epidural Patient position: sitting Prep: site prepped and draped and DuraPrep Patient monitoring: continuous pulse ox, blood pressure, heart rate and cardiac monitor Approach: midline Location: L3-L4 Injection technique: LOR air  Needle:  Needle type: Tuohy  Needle gauge: 17 G Needle length: 9 cm Needle insertion depth: 7 cm Catheter type: closed end flexible Catheter size: 19 Gauge Catheter at skin depth: 12 cm Test dose: negative and Other (1% lidocaine)  Assessment Events: blood not aspirated, injection not painful, no injection resistance, negative IV test and no paresthesia  Additional Notes Patient identified. Risks, benefits, and alternatives discussed with patient including but not limited to bleeding, infection, nerve damage, paralysis, failed block, incomplete pain control, headache, blood pressure changes, nausea, vomiting, reactions to medication, itching, and postpartum back pain. Confirmed with bedside nurse the patient's most recent platelet count. Confirmed with patient that they are not currently taking any anticoagulation, have any bleeding history, or any family history of bleeding disorders. Patient expressed understanding and wished to proceed. All questions were answered. Sterile technique was used throughout the entire procedure. Please see nursing notes for vital signs. Crisp LOR after one needle redirection. Test dose was given through epidural catheter and negative prior to continuing to dose epidural or start infusion. Warning signs of high block given to the patient including shortness of breath,  tingling/numbness in hands, complete motor block, or any concerning symptoms with instructions to call for help. Patient was given instructions on fall risk and not to get out of bed. All questions and concerns addressed with instructions to call with any issues or inadequate analgesia. Patient more comfortable with contractions prior to leaving room.  Reason for block:procedure for pain

## 2018-07-24 NOTE — Progress Notes (Signed)
Vitals:   07/24/18 1735 07/24/18 1800  BP: 115/72 113/77  Pulse: 93 99  Resp: 18 16  Temp:    SpO2:     Ctx are stronger, q 2-3 minutes. but no change in cx (1.5/50/-2).  FHR Cat 1. Foley inserted and inflated w/60cc H20.  Pt taking IV pain meds, may go ahead and get epidural soon.

## 2018-07-24 NOTE — MAU Provider Note (Signed)
  S: Ms. Cheryl Henry is a 23 y.o. G3P0020 at [redacted]w[redacted]d  who presents to MAU today complaining contractions q 2-3 minutes since 1100. She denies vaginal bleeding. She endorses LOF. She reports normal fetal movement.    O: BP 124/71 (BP Location: Right Arm)   Pulse (!) 139   Temp 98.8 F (37.1 C) (Oral)   Resp 18   Ht 5' 2.5" (1.588 m)   Wt 100.2 kg   LMP 07/21/2017 Comment: bilghted ovum  SpO2 95%   BMI 39.78 kg/m  GENERAL: Well-developed, well-nourished female in no acute distress.  HEAD: Normocephalic, atraumatic.  CHEST: Normal effort of breathing, regular heart rate ABDOMEN: Soft, nontender, gravid  Cervical exam:  Dilation: 1.5 Effacement (%): 70, 60 Cervical Position: Middle Station: -3 Presentation: Vertex Exam by:: C Robinson RNC   Fetal Monitoring: Baseline: 140 bpm Variability: moderate Accelerations: 10 x 10 Decelerations: none Contractions: q 2 minutes   + Fern   Pt informed that the ultrasound is considered a limited OB ultrasound and is not intended to be a complete ultrasound exam.  Patient also informed that the ultrasound is not being completed with the intent of assessing for fetal or placental anomalies or any pelvic abnormalities.  Explained that the purpose of today's ultrasound is to assess for  presentation.  Patient acknowledges the purpose of the exam and the limitations of the study. Cephalic presentation confirmed.    A: SIUP at [redacted]w[redacted]d  SROM  P: Admit to L&D  Marny Lowenstein, PA-C 07/24/2018 2:06 PM

## 2018-07-24 NOTE — H&P (Signed)
Cheryl Henry is a 23 y.o. female G3P0020 with IUP at 28w5dpresenting for ROM/contractions. Pt states she has been having irregular, every 2-5 minutes contractions, associated with none vaginal bleeding for 3 hours..  Membranes are ruptured, meconium stained moderate at 1100, ctx started afterwards, with active fetal movement.   [facility] Prenatal History/Complications:  2 early SABs  Hx depression/overdose as a teenager; has met w/BH this pregnancy, no issues  Past Medical History: Past Medical History:  Diagnosis Date  . Anxiety   . Asthma    as child   . Depression    doing ok  . Infection    UTI    Past Surgical History: Past Surgical History:  Procedure Laterality Date  . MYRINGOTOMY WITH TUBE PLACEMENT    . TONSILLECTOMY      Obstetrical History: OB History    Gravida  3   Para  0   Term  0   Preterm  0   AB  2   Living  0     SAB  2   TAB  0   Ectopic  0   Multiple  0   Live Births  0            Social History: Social History   Socioeconomic History  . Marital status: Single    Spouse name: Not on file  . Number of children: Not on file  . Years of education: Not on file  . Highest education level: Not on file  Occupational History  . Occupation: unemployed  Social Needs  . Financial resource strain: Not hard at all  . Food insecurity:    Worry: Never true    Inability: Never true  . Transportation needs:    Medical: No    Non-medical: No  Tobacco Use  . Smoking status: Former Smoker    Types: Cigarettes  . Smokeless tobacco: Never Used  . Tobacco comment: in HS  Substance and Sexual Activity  . Alcohol use: Not Currently    Comment: occ  . Drug use: No  . Sexual activity: Yes    Partners: Male    Birth control/protection: None  Lifestyle  . Physical activity:    Days per week: Not on file    Minutes per session: Not on file  . Stress: Not at all  Relationships  . Social connections:    Talks on phone: Not on  file    Gets together: Not on file    Attends religious service: Not on file    Active member of club or organization: Not on file    Attends meetings of clubs or organizations: Not on file    Relationship status: Not on file  Other Topics Concern  . Not on file  Social History Narrative   Pt states that the only thing that she smokes is hookah occasionally.    Family History: Family History  Problem Relation Age of Onset  . Bipolar disorder Sister   . Heart disease Maternal Grandmother        chf  . Diabetes Maternal Grandmother   . Heart disease Maternal Grandfather     Allergies: Allergies  Allergen Reactions  . Prednisone Rash    Medications Prior to Admission  Medication Sig Dispense Refill Last Dose  . acetaminophen (TYLENOL) 325 MG tablet Take 650 mg by mouth every 6 (six) hours as needed.   Past Week at Unknown time  . Prenatal Vit-Fe Fumarate-FA (PRENATAL COMPLETE)  14-0.4 MG TABS Take 1 tablet by mouth daily. 60 each 0 07/23/2018 at Unknown time  . albuterol (PROVENTIL HFA;VENTOLIN HFA) 108 (90 Base) MCG/ACT inhaler Inhale 2 puffs into the lungs every 6 (six) hours as needed for wheezing or shortness of breath. (Patient not taking: Reported on 04/28/2018) 1 Inhaler 2 More than a month at Unknown time  . cyclobenzaprine (FLEXERIL) 10 MG tablet Take 1 tablet (10 mg total) by mouth 3 (three) times daily as needed for muscle spasms. 15 tablet 0 Taking        Review of Systems   Constitutional: Negative for fever and chills Eyes: Negative for visual disturbances Respiratory: Negative for shortness of breath, dyspnea Cardiovascular: Negative for chest pain or palpitations  Gastrointestinal: Negative for vomiting, diarrhea and constipation.  POSITIVE for abdominal pain (contractions) Genitourinary: Negative for dysuria and urgency Musculoskeletal: Negative for back pain, joint pain, myalgias  Neurological: Negative for dizziness and headaches      Blood pressure  124/71, pulse (!) 139, temperature 98.8 F (37.1 C), temperature source Oral, resp. rate 18, height 5' 2.5" (1.588 m), weight 100.2 kg, last menstrual period 07/21/2017, SpO2 95 %, unknown if currently breastfeeding. General appearance: alert, cooperative and no distress Lungs: normal respiratory effort Heart: regular rate and rhythm Abdomen: soft, non-tender Extremities: Homans sign is negative, no sign of DVT DTR's 2+ Presentation: cephalic Fetal monitoring  Baseline: 145 bpm Uterine activity  2-4 minutes, mild Dilation: 1.5 Effacement (%): 70, 60 Station: -3 Exam by:: Noel Gerold RNC   Prenatal labs: ABO, Rh: O/Positive/-- (10/24 5697) Antibody: Negative (10/24 0948) Rubella: 1.06 (10/24 0948) RPR: Non Reactive (02/12 0826)  HBsAg: Negative (10/24 0948)  HIV: Non Reactive (02/12 0826)  GBS: Negative (04/07 0925)    Prenatal Transfer Tool  Maternal Diabetes: No Genetic Screening: Normal Maternal Ultrasounds/Referrals: Normal Fetal Ultrasounds or other Referrals:  None Maternal Substance Abuse:  No Significant Maternal Medications:  None Significant Maternal Lab Results: Lab values include: Group B Strep negative     Results for orders placed or performed during the hospital encounter of 07/24/18 (from the past 24 hour(s))  CBC   Collection Time: 07/24/18  2:06 PM  Result Value Ref Range   WBC 9.6 4.0 - 10.5 K/uL   RBC 4.28 3.87 - 5.11 MIL/uL   Hemoglobin 12.8 12.0 - 15.0 g/dL   HCT 38.4 36.0 - 46.0 %   MCV 89.7 80.0 - 100.0 fL   MCH 29.9 26.0 - 34.0 pg   MCHC 33.3 30.0 - 36.0 g/dL   RDW 13.9 11.5 - 15.5 %   Platelets 230 150 - 400 K/uL   nRBC 0.0 0.0 - 0.2 %    Nursing Staff Provider  Office Location  [place] Dating  1st trimester [place]  Language  English Anatomy US  02/23/2018 need repeat US to complete anatomy. Scheduled for 03/23/2018> normal  Flu Vaccine  Declined 02/04/18 Genetic Screen  NIPS:     AFP:  Negative    TDaP vaccine   04/28/2018 Hgb A1C or   GTT Early  Third trimester 78-160-110  Rhogam  O positive    LAB RESULTS   Feeding Plan Breast Blood Type O/Positive/-- (10/24 0948)   Contraception Undecided Antibody Negative (10/24 0948)  Circumcision Yes outpt Rubella 1.06 (10/24 0948)  Pediatrician  List given RPR Non Reactive (10/24 0948)   Support Person James(FOB) HBsAg Negative (10/24 0948)   Prenatal Classes LIST GIVEN HIV Non Reactive (10/24 9480)  BTL Consent  GBS  (  For PCN allergy, check sensitivities) NEG  VBAC Consent  Pap 01/07/18 Negative     Hgb Electro   Negative     CF  Negative     SMA  2    Waterbirth  _0  Class _1  Consent _2  CNM visit    Assessment: Cheryl Henry is a 23 y.o. G3P0020 with an IUP at 77w5dpresenting for ROM/early labor.  Discussed augmenting. Will wait a few hours to see if cx changes.   Plan: #Labor: expectant management #Pain:  Per request #FWB Cat 1   FChristin Fudge5/11/2018, 2:32 PM

## 2018-07-24 NOTE — MAU Note (Signed)
Cheryl Henry is a 23 y.o. at [redacted]w[redacted]d here in MAU reporting: woke up around 11 and went to the bathroom, noticed a gush of fluid when she stood up. States she is still feeling leaking, is wearing an adult diaper. States the fluid has been clear and yellow.  Reports contractions that are every 3-4 min that started around 1200. No bleeding, has not really felt any movement today.  Onset of complaint: 1100  Pain score: 6/10  Vitals:   07/24/18 1325  BP: 124/71  Pulse: (!) 139  Resp: 18  Temp: 98.8 F (37.1 C)  SpO2: 95%     FHT:150  Lab orders placed from triage: none

## 2018-07-24 NOTE — Anesthesia Preprocedure Evaluation (Addendum)
Anesthesia Evaluation  Patient identified by MRN, date of birth, ID band Patient awake    Reviewed: Allergy & Precautions, Patient's Chart, lab work & pertinent test results  History of Anesthesia Complications Negative for: history of anesthetic complications  Airway Mallampati: II  TM Distance: >3 FB Neck ROM: Full    Dental  (+) Chipped, Dental Advisory Given,    Pulmonary former smoker,    Pulmonary exam normal breath sounds clear to auscultation       Cardiovascular negative cardio ROS Normal cardiovascular exam Rhythm:Regular Rate:Normal     Neuro/Psych Anxiety Depression negative neurological ROS  negative psych ROS   GI/Hepatic negative GI ROS, Neg liver ROS,   Endo/Other  BMI 39.8  Renal/GU negative Renal ROS  negative genitourinary   Musculoskeletal negative musculoskeletal ROS (+)   Abdominal   Peds negative pediatric ROS (+)  Hematology negative hematology ROS (+)   Anesthesia Other Findings   Reproductive/Obstetrics (+) Pregnancy Suspected chorioamnionitis, failure to progress                            Anesthesia Physical Anesthesia Plan  ASA: III and emergent  Anesthesia Plan: Epidural   Post-op Pain Management:    Induction:   PONV Risk Score and Plan: 3 and Treatment may vary due to age or medical condition, Ondansetron and Dexamethasone  Airway Management Planned: Natural Airway  Additional Equipment:   Intra-op Plan:   Post-operative Plan:   Informed Consent: I have reviewed the patients History and Physical, chart, labs and discussed the procedure including the risks, benefits and alternatives for the proposed anesthesia with the patient or authorized representative who has indicated his/her understanding and acceptance.       Plan Discussed with: CRNA  Anesthesia Plan Comments: (Epidural to be used for C/S)       Anesthesia Quick  Evaluation

## 2018-07-25 ENCOUNTER — Encounter (HOSPITAL_COMMUNITY)
Admission: AD | Disposition: A | Discharge status: Home / Self Care | Payer: Self-pay | Source: Home / Self Care | Attending: Obstetrics and Gynecology

## 2018-07-25 DIAGNOSIS — O4290 Premature rupture of membranes, unspecified as to length of time between rupture and onset of labor, unspecified weeks of gestation: Secondary | ICD-10-CM | POA: Diagnosis present

## 2018-07-25 DIAGNOSIS — Z3A40 40 weeks gestation of pregnancy: Secondary | ICD-10-CM

## 2018-07-25 DIAGNOSIS — O411290 Chorioamnionitis, unspecified trimester, not applicable or unspecified: Secondary | ICD-10-CM | POA: Diagnosis not present

## 2018-07-25 DIAGNOSIS — O480 Post-term pregnancy: Secondary | ICD-10-CM

## 2018-07-25 LAB — RPR: RPR Ser Ql: NONREACTIVE

## 2018-07-25 SURGERY — Surgical Case
Anesthesia: Epidural | Site: Abdomen | Wound class: Clean Contaminated

## 2018-07-25 MED ORDER — FENTANYL CITRATE (PF) 100 MCG/2ML IJ SOLN
INTRAMUSCULAR | Status: AC
  Filled 2018-07-25: qty 2

## 2018-07-25 MED ORDER — KETOROLAC TROMETHAMINE 30 MG/ML IJ SOLN: 30.0000 mg | Freq: Once | INTRAMUSCULAR | Status: DC | PRN

## 2018-07-25 MED ORDER — LACTATED RINGERS IV SOLN
INTRAVENOUS | Status: DC | PRN
  Administered 2018-07-25: 19:00:00 via INTRAVENOUS

## 2018-07-25 MED ORDER — ONDANSETRON HCL 4 MG/2ML IJ SOLN
INTRAMUSCULAR | Status: DC | PRN
  Administered 2018-07-25: 4 mg via INTRAVENOUS

## 2018-07-25 MED ORDER — SODIUM CHLORIDE 0.9 % IV SOLN
INTRAVENOUS | Status: DC | PRN
  Administered 2018-07-25: 60 ug/min via INTRAVENOUS

## 2018-07-25 MED ORDER — ONDANSETRON HCL 4 MG/2ML IJ SOLN: 4.0000 mg | Freq: Three times a day (TID) | INTRAMUSCULAR | Status: DC | PRN

## 2018-07-25 MED ORDER — SODIUM CHLORIDE 0.9 % IR SOLN
Status: DC | PRN
  Administered 2018-07-25: 1000 mL

## 2018-07-25 MED ORDER — GENTAMICIN SULFATE 40 MG/ML IJ SOLN
180.0000 mg | Freq: Three times a day (TID) | INTRAVENOUS | Status: DC
  Administered 2018-07-25 (×2): 180 mg via INTRAVENOUS
  Filled 2018-07-25 (×4): qty 4.5

## 2018-07-25 MED ORDER — SODIUM BICARBONATE 8.4 % IV SOLN
INTRAVENOUS | Status: DC | PRN
  Administered 2018-07-25 (×2): 5 mL via EPIDURAL

## 2018-07-25 MED ORDER — LIDOCAINE-EPINEPHRINE (PF) 2 %-1:200000 IJ SOLN
INTRAMUSCULAR | Status: AC
  Filled 2018-07-25: qty 10

## 2018-07-25 MED ORDER — PHENYLEPHRINE 40 MCG/ML (10ML) SYRINGE FOR IV PUSH (FOR BLOOD PRESSURE SUPPORT)
PREFILLED_SYRINGE | INTRAVENOUS | Status: AC
  Filled 2018-07-25: qty 10

## 2018-07-25 MED ORDER — IBUPROFEN 800 MG PO TABS
800.0000 mg | ORAL_TABLET | Freq: Three times a day (TID) | ORAL | Status: DC
  Administered 2018-07-26 – 2018-07-28 (×5): 800 mg via ORAL
  Filled 2018-07-25 (×5): qty 1

## 2018-07-25 MED ORDER — KETOROLAC TROMETHAMINE 30 MG/ML IJ SOLN
30.0000 mg | Freq: Four times a day (QID) | INTRAMUSCULAR | Status: AC
  Administered 2018-07-26 (×3): 30 mg via INTRAVENOUS
  Filled 2018-07-25 (×4): qty 1

## 2018-07-25 MED ORDER — SIMETHICONE 80 MG PO CHEW: 80.0000 mg | CHEWABLE_TABLET | ORAL | Status: DC | PRN

## 2018-07-25 MED ORDER — TETANUS-DIPHTH-ACELL PERTUSSIS 5-2.5-18.5 LF-MCG/0.5 IM SUSP: 0.5000 mL | Freq: Once | INTRAMUSCULAR | Status: DC

## 2018-07-25 MED ORDER — OXYCODONE HCL 5 MG PO TABS: 5.0000 mg | ORAL_TABLET | ORAL | Status: DC | PRN

## 2018-07-25 MED ORDER — NALOXONE HCL 0.4 MG/ML IJ SOLN: 0.4000 mg | INTRAMUSCULAR | Status: DC | PRN

## 2018-07-25 MED ORDER — OXYTOCIN 40 UNITS IN NORMAL SALINE INFUSION - SIMPLE MED
INTRAVENOUS | Status: AC
  Filled 2018-07-25: qty 1000

## 2018-07-25 MED ORDER — LACTATED RINGERS IV SOLN
INTRAVENOUS | Status: DC
  Administered 2018-07-26: 07:00:00 via INTRAVENOUS

## 2018-07-25 MED ORDER — SIMETHICONE 80 MG PO CHEW
80.0000 mg | CHEWABLE_TABLET | ORAL | Status: DC
  Administered 2018-07-25 – 2018-07-28 (×3): 80 mg via ORAL
  Filled 2018-07-25 (×3): qty 1

## 2018-07-25 MED ORDER — MIDAZOLAM HCL 2 MG/2ML IJ SOLN
INTRAMUSCULAR | Status: AC
  Filled 2018-07-25: qty 2

## 2018-07-25 MED ORDER — ACETAMINOPHEN 500 MG PO TABS
1000.0000 mg | ORAL_TABLET | Freq: Four times a day (QID) | ORAL | Status: AC
  Administered 2018-07-25 – 2018-07-26 (×4): 1000 mg via ORAL
  Filled 2018-07-25 (×3): qty 2

## 2018-07-25 MED ORDER — KETOROLAC TROMETHAMINE 30 MG/ML IJ SOLN
INTRAMUSCULAR | Status: AC
  Filled 2018-07-25: qty 1

## 2018-07-25 MED ORDER — WITCH HAZEL-GLYCERIN EX PADS: 1.0000 "application " | MEDICATED_PAD | CUTANEOUS | Status: DC | PRN

## 2018-07-25 MED ORDER — SODIUM CHLORIDE 0.9% FLUSH: 3.0000 mL | INTRAVENOUS | Status: DC | PRN

## 2018-07-25 MED ORDER — DIPHENHYDRAMINE HCL 25 MG PO CAPS: 25.0000 mg | ORAL_CAPSULE | ORAL | Status: DC | PRN

## 2018-07-25 MED ORDER — FENTANYL CITRATE (PF) 100 MCG/2ML IJ SOLN: 25.0000 ug | INTRAMUSCULAR | Status: DC | PRN

## 2018-07-25 MED ORDER — NALBUPHINE HCL 10 MG/ML IJ SOLN: 5.0000 mg | Freq: Once | INTRAMUSCULAR | Status: DC | PRN

## 2018-07-25 MED ORDER — GENTAMICIN SULFATE 40 MG/ML IJ SOLN
180.0000 mg | Freq: Three times a day (TID) | INTRAVENOUS | Status: AC
  Administered 2018-07-26 (×3): 180 mg via INTRAVENOUS
  Filled 2018-07-25 (×3): qty 4.5

## 2018-07-25 MED ORDER — PHENYLEPHRINE HCL-NACL 20-0.9 MG/250ML-% IV SOLN
INTRAVENOUS | Status: AC
  Filled 2018-07-25: qty 250

## 2018-07-25 MED ORDER — DIPHENHYDRAMINE HCL 50 MG/ML IJ SOLN: 12.5000 mg | INTRAMUSCULAR | Status: DC | PRN

## 2018-07-25 MED ORDER — COCONUT OIL OIL: 1.0000 "application " | TOPICAL_OIL | Status: DC | PRN

## 2018-07-25 MED ORDER — MENTHOL 3 MG MT LOZG: 1.0000 | LOZENGE | OROMUCOSAL | Status: DC | PRN

## 2018-07-25 MED ORDER — SODIUM CHLORIDE 0.9 % IV SOLN
INTRAVENOUS | Status: DC | PRN
  Administered 2018-07-25: 19:00:00 40 [IU] via INTRAVENOUS

## 2018-07-25 MED ORDER — NALBUPHINE HCL 10 MG/ML IJ SOLN: 5.0000 mg | INTRAMUSCULAR | Status: DC | PRN

## 2018-07-25 MED ORDER — MIDAZOLAM HCL 2 MG/2ML IJ SOLN
INTRAMUSCULAR | Status: DC | PRN
  Administered 2018-07-25: 1 mg via INTRAVENOUS

## 2018-07-25 MED ORDER — PHENYLEPHRINE 40 MCG/ML (10ML) SYRINGE FOR IV PUSH (FOR BLOOD PRESSURE SUPPORT)
PREFILLED_SYRINGE | INTRAVENOUS | Status: DC | PRN
  Administered 2018-07-25: 40 ug via INTRAVENOUS

## 2018-07-25 MED ORDER — SODIUM CHLORIDE 0.9 % IV SOLN
500.0000 mg | Freq: Once | INTRAVENOUS | Status: AC
  Administered 2018-07-25: 19:00:00 500 mg via INTRAVENOUS
  Filled 2018-07-25 (×2): qty 500

## 2018-07-25 MED ORDER — DIBUCAINE (PERIANAL) 1 % EX OINT: 1.0000 "application " | TOPICAL_OINTMENT | CUTANEOUS | Status: DC | PRN

## 2018-07-25 MED ORDER — ACETAMINOPHEN 500 MG PO TABS
ORAL_TABLET | ORAL | Status: AC
  Filled 2018-07-25: qty 2

## 2018-07-25 MED ORDER — ACETAMINOPHEN 325 MG PO TABS
650.0000 mg | ORAL_TABLET | ORAL | Status: DC | PRN
  Administered 2018-07-26 – 2018-07-28 (×3): 650 mg via ORAL
  Filled 2018-07-25 (×3): qty 2

## 2018-07-25 MED ORDER — DEXAMETHASONE SODIUM PHOSPHATE 10 MG/ML IJ SOLN
INTRAMUSCULAR | Status: DC | PRN
  Administered 2018-07-25: 5 mg via INTRAVENOUS

## 2018-07-25 MED ORDER — ONDANSETRON HCL 4 MG/2ML IJ SOLN
INTRAMUSCULAR | Status: AC
  Filled 2018-07-25: qty 2

## 2018-07-25 MED ORDER — ACETAMINOPHEN 500 MG PO TABS
1000.0000 mg | ORAL_TABLET | Freq: Once | ORAL | Status: AC
  Administered 2018-07-25: 07:00:00 1000 mg via ORAL
  Filled 2018-07-25: qty 2

## 2018-07-25 MED ORDER — KETOROLAC TROMETHAMINE 30 MG/ML IJ SOLN: 30.0000 mg | Freq: Four times a day (QID) | INTRAMUSCULAR | Status: AC | PRN

## 2018-07-25 MED ORDER — CEFAZOLIN SODIUM-DEXTROSE 2-4 GM/100ML-% IV SOLN
INTRAVENOUS | Status: AC
  Filled 2018-07-25: qty 100

## 2018-07-25 MED ORDER — PRENATAL MULTIVITAMIN CH
1.0000 | ORAL_TABLET | Freq: Every day | ORAL | Status: DC
  Administered 2018-07-26 – 2018-07-28 (×3): 1 via ORAL
  Filled 2018-07-25 (×3): qty 1

## 2018-07-25 MED ORDER — KETOROLAC TROMETHAMINE 30 MG/ML IJ SOLN
30.0000 mg | Freq: Four times a day (QID) | INTRAMUSCULAR | Status: AC | PRN
  Administered 2018-07-25: 30 mg via INTRAMUSCULAR

## 2018-07-25 MED ORDER — MEPERIDINE HCL 25 MG/ML IJ SOLN
INTRAMUSCULAR | Status: AC
  Filled 2018-07-25: qty 1

## 2018-07-25 MED ORDER — NALOXONE HCL 4 MG/10ML IJ SOLN
1.0000 ug/kg/h | INTRAVENOUS | Status: DC | PRN
  Filled 2018-07-25: qty 5

## 2018-07-25 MED ORDER — SODIUM CHLORIDE 0.9 % IV SOLN
2.0000 g | Freq: Four times a day (QID) | INTRAVENOUS | Status: DC
  Administered 2018-07-25 (×2): 2 g via INTRAVENOUS
  Filled 2018-07-25 (×4): qty 2000

## 2018-07-25 MED ORDER — SODIUM CHLORIDE 0.9 % IV SOLN
2.0000 g | Freq: Three times a day (TID) | INTRAVENOUS | Status: AC
  Administered 2018-07-25 – 2018-07-26 (×3): 2 g via INTRAVENOUS
  Filled 2018-07-25 (×4): qty 2000

## 2018-07-25 MED ORDER — MEPERIDINE HCL 25 MG/ML IJ SOLN
INTRAMUSCULAR | Status: DC | PRN
  Administered 2018-07-25: 25 mg via INTRAVENOUS

## 2018-07-25 MED ORDER — SIMETHICONE 80 MG PO CHEW
80.0000 mg | CHEWABLE_TABLET | Freq: Three times a day (TID) | ORAL | Status: DC
  Administered 2018-07-26 – 2018-07-28 (×6): 80 mg via ORAL
  Filled 2018-07-25 (×6): qty 1

## 2018-07-25 MED ORDER — OXYTOCIN 40 UNITS IN NORMAL SALINE INFUSION - SIMPLE MED: 1.0000 m[IU]/min | INTRAVENOUS | Status: DC

## 2018-07-25 MED ORDER — SENNOSIDES-DOCUSATE SODIUM 8.6-50 MG PO TABS
2.0000 | ORAL_TABLET | ORAL | Status: DC
  Administered 2018-07-25 – 2018-07-28 (×3): 2 via ORAL
  Filled 2018-07-25 (×3): qty 2

## 2018-07-25 MED ORDER — MORPHINE SULFATE (PF) 0.5 MG/ML IJ SOLN
INTRAMUSCULAR | Status: AC
  Filled 2018-07-25: qty 10

## 2018-07-25 MED ORDER — ZOLPIDEM TARTRATE 5 MG PO TABS: 5.0000 mg | ORAL_TABLET | Freq: Every evening | ORAL | Status: DC | PRN

## 2018-07-25 MED ORDER — DIPHENHYDRAMINE HCL 25 MG PO CAPS: 25.0000 mg | ORAL_CAPSULE | Freq: Four times a day (QID) | ORAL | Status: DC | PRN

## 2018-07-25 MED ORDER — DEXAMETHASONE SODIUM PHOSPHATE 10 MG/ML IJ SOLN
INTRAMUSCULAR | Status: AC
  Filled 2018-07-25: qty 1

## 2018-07-25 MED ORDER — OXYTOCIN 40 UNITS IN NORMAL SALINE INFUSION - SIMPLE MED: 2.5000 [IU]/h | INTRAVENOUS | Status: AC

## 2018-07-25 MED ORDER — LIDOCAINE-EPINEPHRINE (PF) 2 %-1:200000 IJ SOLN
INTRAMUSCULAR | Status: DC | PRN
  Administered 2018-07-25: 5 mL via EPIDURAL

## 2018-07-25 MED ORDER — MORPHINE SULFATE (PF) 0.5 MG/ML IJ SOLN
INTRAMUSCULAR | Status: DC | PRN
  Administered 2018-07-25: 2 mg via EPIDURAL

## 2018-07-25 MED ORDER — FENTANYL CITRATE (PF) 100 MCG/2ML IJ SOLN
INTRAMUSCULAR | Status: DC | PRN
  Administered 2018-07-25 (×2): 100 ug via EPIDURAL

## 2018-07-25 MED ORDER — ENOXAPARIN SODIUM 60 MG/0.6ML ~~LOC~~ SOLN
50.0000 mg | SUBCUTANEOUS | Status: DC
  Administered 2018-07-26 – 2018-07-28 (×3): 50 mg via SUBCUTANEOUS
  Filled 2018-07-25 (×3): qty 0.6

## 2018-07-25 MED ORDER — PROMETHAZINE HCL 25 MG/ML IJ SOLN: 6.2500 mg | INTRAMUSCULAR | Status: DC | PRN

## 2018-07-25 SURGICAL SUPPLY — 41 items
APL SKNCLS STERI-STRIP NONHPOA (GAUZE/BANDAGES/DRESSINGS) ×1
BENZOIN TINCTURE PRP APPL 2/3 (GAUZE/BANDAGES/DRESSINGS) ×3 IMPLANT
CHLORAPREP W/TINT 26ML (MISCELLANEOUS) ×3 IMPLANT
CLAMP CORD UMBIL (MISCELLANEOUS) IMPLANT
CLOSURE WOUND 1/2 X4 (GAUZE/BANDAGES/DRESSINGS) ×1
CLOTH BEACON ORANGE TIMEOUT ST (SAFETY) ×3 IMPLANT
DRAPE C SECTION CLR SCREEN (DRAPES) IMPLANT
DRSG OPSITE POSTOP 4X10 (GAUZE/BANDAGES/DRESSINGS) ×3 IMPLANT
ELECT REM PT RETURN 9FT ADLT (ELECTROSURGICAL) ×3
ELECTRODE REM PT RTRN 9FT ADLT (ELECTROSURGICAL) ×1 IMPLANT
EXTRACTOR VACUUM M CUP 4 TUBE (SUCTIONS) IMPLANT
EXTRACTOR VACUUM M CUP 4' TUBE (SUCTIONS)
GLOVE BIO SURGEON STRL SZ7.5 (GLOVE) ×3 IMPLANT
GLOVE BIOGEL PI IND STRL 7.0 (GLOVE) ×1 IMPLANT
GLOVE BIOGEL PI INDICATOR 7.0 (GLOVE) ×2
GOWN STRL REUS W/TWL 2XL LVL3 (GOWN DISPOSABLE) ×3 IMPLANT
GOWN STRL REUS W/TWL LRG LVL3 (GOWN DISPOSABLE) ×6 IMPLANT
KIT ABG SYR 3ML LUER SLIP (SYRINGE) IMPLANT
NDL HYPO 25X5/8 SAFETYGLIDE (NEEDLE) IMPLANT
NEEDLE HYPO 22GX1.5 SAFETY (NEEDLE) ×3 IMPLANT
NEEDLE HYPO 25X5/8 SAFETYGLIDE (NEEDLE) IMPLANT
NS IRRIG 1000ML POUR BTL (IV SOLUTION) ×3 IMPLANT
PACK C SECTION WH (CUSTOM PROCEDURE TRAY) ×3 IMPLANT
PAD OB MATERNITY 4.3X12.25 (PERSONAL CARE ITEMS) ×3 IMPLANT
PENCIL SMOKE EVAC W/HOLSTER (ELECTROSURGICAL) ×3 IMPLANT
RTRCTR C-SECT PINK 25CM LRG (MISCELLANEOUS) ×3 IMPLANT
STRIP CLOSURE SKIN 1/2X4 (GAUZE/BANDAGES/DRESSINGS) ×2 IMPLANT
SUT CHROMIC 1 CTX 36 (SUTURE) ×6 IMPLANT
SUT VIC AB 1 CT1 36 (SUTURE) ×6 IMPLANT
SUT VIC AB 2-0 CT1 (SUTURE) ×3 IMPLANT
SUT VIC AB 2-0 CT1 27 (SUTURE) ×3
SUT VIC AB 2-0 CT1 TAPERPNT 27 (SUTURE) ×1 IMPLANT
SUT VIC AB 3-0 CT1 27 (SUTURE) ×6
SUT VIC AB 3-0 CT1 TAPERPNT 27 (SUTURE) ×2 IMPLANT
SUT VIC AB 3-0 SH 27 (SUTURE)
SUT VIC AB 3-0 SH 27X BRD (SUTURE) IMPLANT
SUT VIC AB 4-0 KS 27 (SUTURE) ×3 IMPLANT
SYR BULB IRRIGATION 50ML (SYRINGE) IMPLANT
TOWEL OR 17X24 6PK STRL BLUE (TOWEL DISPOSABLE) ×3 IMPLANT
TRAY FOLEY W/BAG SLVR 14FR LF (SET/KITS/TRAYS/PACK) ×3 IMPLANT
WATER STERILE IRR 1000ML POUR (IV SOLUTION) ×3 IMPLANT

## 2018-07-25 NOTE — Progress Notes (Signed)
OB/GYN Faculty Practice: Labor Progress Note  Subjective: Doing well, feeling pressure on right side. Boyfriend still in room.  Objective: BP 101/70   Pulse (!) 117   Temp 98.7 F (37.1 C) (Axillary)   Resp 20   Ht 5' 2.5" (1.588 m)   Wt 100.2 kg   LMP 07/21/2017 Comment: bilghted ovum  SpO2 100%   BMI 39.78 kg/m  Gen: well-appearing, NAD Dilation: 6 Effacement (%): 90 Cervical Position: Middle Station: -1 Presentation: Vertex Exam by:: Dr. Earlene Plater   Assessment and Plan: 23 y.o. [id number] [redacted]w[redacted]d here with SROM, early labor.   Labor: Cervix unchanged for last 6 hours, this is my first exam and I think she is closer to 5-6/80/-1, seems to have room on left side of pelvis, suspect descending asynclitic. Will try exaggerated side-lying position up in stirrups, continue to titrate pitocin to get adequate contractions. Will switch pitocin to 2x2. Updated Dr. Alysia Penna regarding tracing, progress -- triple I: improving, continue antibiotics and monitor for HR and fevers -- pain control: epidural  -- PPH Risk: medium   Fetal Well-Being: EFW 7-8lbs by Leopolds. Cephalic by sutures on prior checks.  -- Category I - continuous fetal monitoring  -- GBS negative    Cheryl Henry S. Earlene Plater, DO OB/GYN Fellow, Faculty Practice  3:04 PM

## 2018-07-25 NOTE — Anesthesia Postprocedure Evaluation (Signed)
Anesthesia Post Note  Patient: Cheryl Henry  Procedure(s) Performed: CESAREAN SECTION (N/A Abdomen)     Patient location during evaluation: PACU Anesthesia Type: Epidural Level of consciousness: awake and alert Pain management: pain level controlled Vital Signs Assessment: post-procedure vital signs reviewed and stable Respiratory status: spontaneous breathing, nonlabored ventilation and respiratory function stable Cardiovascular status: blood pressure returned to baseline and stable Postop Assessment: no apparent nausea or vomiting Anesthetic complications: no             Kaylyn Layer

## 2018-07-25 NOTE — Progress Notes (Signed)
Patient ID: Cheryl Henry, female   DOB: Feb 12, 1996, 23 y.o.   MRN: [id number] [facility] Attending  Pt admitted for IOL d/t SROM. No cervical dilatation and descent, despite adequate labor.See Dr. Cleaster Corin PN Pt also being treated for Triple. Feel c section indicated and recommended to pt.  The risks of cesarean section discussed with the patient included but were not limited to: bleeding which may require transfusion or reoperation; infection which may require antibiotics; injury to bowel, bladder, ureters or other surrounding organs; injury to the fetus; need for additional procedures including hysterectomy in the event of a life-threatening hemorrhage; placental abnormalities wth subsequent pregnancies, incisional problems, thromboembolic phenomenon and other postoperative/anesthesia complications. The patient concurred with the proposed plan, giving informed written consent for the procedure.   Anesthesia and OR aware. Preoperative prophylactic antibiotics and SCDs ordered on call to the OR.  To OR when ready.  Ralf Konopka L. Alysia Penna, MD

## 2018-07-25 NOTE — Op Note (Signed)
Cheryl Henry PROCEDURE DATE: 07/25/2018  PREOPERATIVE DIAGNOSES: Intrauterine pregnancy at [redacted]w[redacted]d weeks gestation; arrest of dilation/failure to progress  triple I  POSTOPERATIVE DIAGNOSES: The same  PROCEDURE: Primary Low Transverse Cesarean Section  SURGEON:  Dr. Nettie Elm  ASSISTANT:  Dr. Rhett Bannister   ANESTHESIOLOGY TEAM: Anesthesiologist: Phillips Grout, MD; Achille Rich, MD; Kaylyn Layer, MD CRNA: Yolonda Kida, CRNA; Shanon Payor, CRNA  INDICATIONS: Cheryl Henry is a 23 y.o. [phone] at [redacted]w[redacted]d here for cesarean section secondary to the indications listed under preoperative diagnoses; please see preoperative note for further details.  The risks of cesarean section were discussed with the patient including but were not limited to: bleeding which may require transfusion or reoperation; infection which may require antibiotics; injury to bowel, bladder, ureters or other surrounding organs; injury to the fetus; need for additional procedures including hysterectomy in the event of a life-threatening hemorrhage; placental abnormalities wth subsequent pregnancies, incisional problems, thromboembolic phenomenon and other postoperative/anesthesia complications.   The patient concurred with the proposed plan, giving informed written consent for the procedure.    FINDINGS:  Viable female infant in cephalic presentation.  Apgars 4 and 8.  Thick meconium amniotic fluid.  Intact placenta, three vessel cord.  Normal uterus, fallopian tubes and ovaries bilaterally.  ANESTHESIA: SEpidural  INTRAVENOUS FLUIDS: 500 ml   ESTIMATED BLOOD LOSS: 599 ml URINE OUTPUT:  200 ml SPECIMENS: Placenta sent to pathology COMPLICATIONS: None immediate  PROCEDURE IN DETAIL:  The patient preoperatively received intravenous antibiotics and had sequential compression devices applied to her lower extremities.  She was then taken to the operating room where the epidural anesthesia was dosed up to  surgical level and was found to be adequate. She was then placed in a dorsal supine position with a leftward tilt, and prepped and draped in a sterile manner.  A foley catheter had previously been placed into her bladder and attached to constant gravity.  After an adequate timeout was performed, a Pfannenstiel skin incision was made with scalpel and carried through to the underlying layer of fascia. The fascia was incised in the midline, and this incision was extended bilaterally using the Mayo scissors.  Kocher clamps were applied to the superior aspect of the fascial incision and the underlying rectus muscles were dissected off bluntly.  A similar process was carried out on the inferior aspect of the fascial incision. The rectus muscles were separated in the midline and the peritoneum was entered bluntly. The Alexis self-retaining retractor was introduced into the abdominal cavity.  Attention was turned to the lower uterine segment where a low transverse hysterotomy was made with a scalpel and extended bilaterally bluntly.  The infant was successfully delivered, the cord was clamped and cut immediately, and the infant was handed over to the awaiting neonatology team. Uterine massage was then administered, and the placenta delivered intact with a three-vessel cord. The uterus was then cleared of clots and debris.  The hysterotomy was closed with 0 Chromic in a running locked fashion, and an imbricating layer was also placed with 0 Chromic.  The pelvis was cleared of all clot and debris. Hemostasis was confirmed on all surfaces.  The retractor was removed.  The peritoneum was closed with a 2-0 Vicryl running stitch. The fascia was then closed using 0 Vicryl in a running fashion.  The subcutaneous layer was irrigated, reapproximated with 3-0 Vicryl interrupted stitches, and the skin was closed with a 4-0 Vicryl subcuticular stitch. The patient tolerated the procedure  well. Sponge, instrument and needle counts were  correct x 3.  She was taken to the recovery room in stable condition.   Cheryl DeerLaurel S. Earlene PlaterWallace, DO OB/GYN Fellow

## 2018-07-25 NOTE — Lactation Note (Addendum)
This note was copied from a baby's chart. Lactation Consultation Note  Patient Name: Cheryl Henry [id number] Date: 07/25/2018 Reason for consult: Initial assessment;Term;1st time breastfeeding P1, 4 hour female infant, C/C delivery. Per parents, infant did not latch in L&D. Per mom, infant had meconium inside of her, infant snorting with nasal sound.  LC discussed hand expression and smear of colostrum present from left breast only. Mom has large flat breast, LC discussed breast stimulation to help evert nipple and pre-pumping. Mom given breast shells to wear during the day to help evert nipples out  and mom understands not to sleep in them at night.  Mom attempted to latch infant after few attempts mom fitted with 20 mm NS, infant latched and suckle for about 3 minutes and fell asleep. LC discussed STS as much as possible with parents and STS benefits.  Mom will use DEBP every 3 hours for 15 minutes after she latches infant to breast.  Mom shown how to use DEBP & how to disassemble, clean, & reassemble parts. Mom was doing STS as LC left the room. LC discussed hunger cues, mom knows to breastfeed infant according hunger cues, 8 or more times within 24 hours. LC discussed I & O. Mom knows to call Nurse or LC if she has any questions, concerns or need assistance with latching infant to breast.  LC discussed [facility]. Mom made aware of O/P services, breastfeeding support groups, community resources, and our phone # for post-discharge questions.  Maternal Data Formula Feeding for Exclusion: No Has patient been taught Hand Expression?: Yes(smear of colostrum present in left breast only at this time.) Does the patient have breastfeeding experience prior to this delivery?: No  Feeding Feeding Type: Breast Fed  LATCH Score Latch: Repeated attempts needed to sustain latch, nipple held in mouth throughout feeding, stimulation needed to elicit sucking  reflex.  Audible Swallowing: A few with stimulation  Type of Nipple: Flat  Comfort (Breast/Nipple): Soft / non-tender  Hold (Positioning): Assistance needed to correctly position infant at breast and maintain latch.  LATCH Score: 6  Interventions Interventions: Breast feeding basics reviewed;Assisted with latch;Skin to skin;Breast massage;Hand express;Support pillows;Adjust position;Shells;Breast compression;Expressed milk;Position options;Pre-pump if needed;DEBP  Lactation Tools Discussed/Used Tools: Shells;Pump;Nipple Shields Nipple shield size: 20 Breast pump type: Double-Electric Breast Pump;Manual(Per mom, she doesn't have breastpump at home.) Pump Review: Milk Storage;Setup, frequency, and cleaning Initiated by:: Danelle Earthly, IBCLC Date initiated:: 07/26/18   Consult Status Consult Status: Follow-up Date: 07/26/18 Follow-up type: In-patient    Danelle Earthly 07/25/2018, 10:51 PM

## 2018-07-25 NOTE — Progress Notes (Signed)
Pharmacy Antibiotic Note  Cheryl Henry is a 23 y.o. female admitted on 07/24/2018 for ROM, labor.  Maternal temp started around 0700, pharmacy asked to dose gentamicin for presumed triple I.   Plan: Gentamicin 180mg  IV Q8 hours  Height: 5' 2.5" (158.8 cm) Weight: 221 lb (100.2 kg) IBW/kg (Calculated) : 51.25  Temp (24hrs), Avg:98.8 F (37.1 C), Min:97.7 F (36.5 C), Max:101.3 F (38.5 C)  Recent Labs  Lab 07/24/18 1406  WBC 9.6    CrCl cannot be calculated (Patient's most recent lab result is older than the maximum 21 days allowed.).    Allergies  Allergen Reactions  . Prednisone Rash    Antimicrobials this admission: Amp Q6 5/10  >>    Thank you for allowing pharmacy to be a part of this patient's care.  Loyola Mast 07/25/2018 8:18 AM

## 2018-07-25 NOTE — Progress Notes (Signed)
OB/GYN Faculty Practice: Labor Progress Note  Subjective: Into room for recheck. Just received bolus of epidural because pain difficult to control, having lower Bps but denies lightheadedness or dizziness. Receiving phenylephrine with some improvement.   Objective: BP (!) 90/48   Pulse (!) 138   Temp 98.5 F (36.9 C) (Oral)   Resp 18   Ht 5' 2.5" (1.588 m)   Wt 100.2 kg   LMP 07/21/2017 Comment: bilghted ovum  SpO2 99%   BMI 39.78 kg/m  Gen: mild rigors, appears tired  Dilation: 6 Effacement (%): 80, 90 Cervical Position: Middle Station: -1 Presentation: Vertex Exam by:: Dr. Earlene Plater   Assessment and Plan: 23 y.o. [id number] [redacted]w[redacted]d here with SROM, early labor.   Labor: Cervix unchanged from last check about 2 hours ago. Have tried multiple position changes. Still not adequate with contractions, continue to titrate pitocin 2x2 and plan to recheck in about 2 hours.  -- triple I: improving, continue antibiotics and monitor for HR and fevers (afebrile since 0700 this morning)  -- pain control: epidural  -- PPH Risk: medium   Fetal Well-Being: EFW 7-8lbs by Leopolds. Cephalic by sutures on prior checks.  -- Category I - continuous fetal monitoring  -- GBS negative    Laurel S. Earlene Plater, DO OB/GYN Fellow, Faculty Practice  4:50 PM

## 2018-07-25 NOTE — Transfer of Care (Signed)
Immediate Anesthesia Transfer of Care Note  Patient: Cheryl Henry  Procedure(s) Performed: CESAREAN SECTION (N/A Abdomen)  Patient Location: PACU  Anesthesia Type:Epidural  Level of Consciousness: awake, alert , oriented and patient cooperative  Airway & Oxygen Therapy: Patient Spontanous Breathing  Post-op Assessment: Report given to RN and Post -op Vital signs reviewed and stable  Post vital signs: Reviewed and stable  Last Vitals:  Vitals Value Taken Time  BP 105/64 07/25/2018  7:26 PM  Temp    Pulse 119 07/25/2018  7:29 PM  Resp 17 07/25/2018  7:29 PM  SpO2 98 % 07/25/2018  7:29 PM  Vitals shown include unvalidated device data.  Last Pain:  Vitals:   07/25/18 1700  TempSrc:   PainSc: 0-No pain         Complications: No apparent anesthesia complications

## 2018-07-25 NOTE — Progress Notes (Signed)
Vitals:   07/25/18 0705 07/25/18 0706  BP: 105/63   Pulse: (!) 125   Resp: 19   Temp: (!) 101.3 F (38.5 C)   SpO2:  99%   MVUs have been adequate until about an hour or so ago where they spaced out.  Cx 4-5/90/-1, so starting to make change.  Temp ^^, FHR baseline ^ to 150's, still Cat 1. Start amp/gent/APAP.

## 2018-07-25 NOTE — Progress Notes (Signed)
Interval Progress Note   Into room to recheck patient - now with Category II strip - 180s/minimal/-a/-d, regular contractions nearly adequate. No interval cervical change. Discussed that given failure to progress (unchanged over last 9 hours), triple I and category II strip, would recommend primary Cesarean section for failure to progress. Called Dr. Alysia Penna to discuss, and he is in agreement with plan. Patient tearful but understanding and in agreement with plan.   The risks of cesarean section discussed with the patient included but were not limited to: bleeding which may require transfusion or reoperation; infection which may require antibiotics; injury to bowel, bladder, ureters or other surrounding organs; injury to the fetus; need for additional procedures including hysterectomy in the event of a life-threatening hemorrhage; placental abnormalities wth subsequent pregnancies, incisional problems, thromboembolic phenomenon and other postoperative/anesthesia complications. The patient concurred with the proposed plan, giving informed written consent for the procedure.   Patient has been NPO since overnight, and she will remain NPO for procedure. Anesthesia and OR aware. Preoperative prophylactic antibiotics and SCDs ordered on call to the OR.  To OR when ready.  Cristal Deer. Earlene Plater, DO OB/GYN Fellow

## 2018-07-25 NOTE — Discharge Summary (Signed)
Obstetrics Discharge Summary OB/GYN Faculty Practice   Patient Name: Cheryl Henry DOB: 1996/02/29 MRN: [id number]  Date of admission: 07/24/2018 Delivering MD: Hermina Staggers   Date of discharge: 07/28/2018  Admitting diagnosis: PROM Intrauterine pregnancy: [redacted]w[redacted]d     Secondary diagnosis:   Principal Problem:   PROM (premature rupture of membranes) Active Problems:   Depression affecting pregnancy, antepartum   Encounter for induction of labor   Asthma   Chorioamnionitis    Discharge diagnosis: Term Pregnancy Delivered by Cesarean section for failure to progress/arrest of dilation                            Postpartum procedures: None  Complications:   Outpatient Follow-Up: [ ]  continue to counsel on mode of contraception [ ]  incision check in 1-2 weeks   Hospital course: Cheryl Henry is a 23 y.o. [redacted]w[redacted]d who was admitted for PROM 5/9 at 1100. Her pregnancy was complicated by obesity, history of depression. Her labor course was notable for induction with FB followed by pitocin about 12 hours after ROM, epidural placement. She developed triple I and was started on ampicillin/gentamicin with resolution of fever but infant with intermittent fetal tachycardia. No change from 6cm for nearly 9 hours, thick meconium and persistent fetal tachycardia in 180s with loss of variability led to decision to proceed with primary LTCS. Delivery was uncomplicated.  Please see delivery/op note for additional details. Her postpartum course was uncomplicated. She was breastfeeding without difficulty. By day of discharge, she was passing flatus, urinating, eating and drinking without difficulty. Her pain was well-controlled, and she was discharged home with Motrin and Percocet.Marland Kitchen She will follow-up in clinic in 1 week for incision check and 4 weeks for PP visit.   Physical exam  Vitals:   07/27/18 0504 07/27/18 1423 07/27/18 2109 07/28/18 0600  BP: (!) 98/54 108/75 108/82 114/68  Pulse: 83 (!) 117 98 96   Resp: 18 16 18 18   Temp: 97.6 F (36.4 C) 98.3 F (36.8 C) 98.3 F (36.8 C) 98.9 F (37.2 C)  TempSrc: Oral Oral Oral Oral  SpO2:      Weight:      Height:       General: alert Lochia: appropriate Uterine Fundus: firm Incision: Healing well with no significant drainage DVT Evaluation: No evidence of DVT seen on physical exam. Labs: Lab Results  Component Value Date   WBC 14.6 (H) 07/26/2018   HGB 9.8 (L) 07/26/2018   HCT 29.7 (L) 07/26/2018   MCV 91.4 07/26/2018   PLT 164 07/26/2018   CMP Latest Ref Rng & Units 07/26/2018  Glucose 70 - 99 mg/dL -  BUN 6 - 20 mg/dL -  Creatinine 8.18 - 5.63 mg/dL 1.49  Sodium 702 - 637 mmol/L -  Potassium 3.5 - 5.1 mmol/L -  Chloride 98 - 111 mmol/L -  CO2 22 - 32 mmol/L -  Calcium 8.9 - 10.3 mg/dL -  Total Protein 6.5 - 8.1 g/dL -  Total Bilirubin 0.3 - 1.2 mg/dL -  Alkaline Phos 38 - 858 U/L -  AST 15 - 41 U/L -  ALT 0 - 44 U/L -    Discharge instructions: Per After Visit Summary and "Baby and Me Booklet"  After visit meds:  Allergies as of 07/28/2018      Reactions   Prednisone Rash      Medication List    STOP taking these medications   acetaminophen  325 MG tablet Commonly known as:  TYLENOL   albuterol 108 (90 Base) MCG/ACT inhaler Commonly known as:  VENTOLIN HFA   cyclobenzaprine 10 MG tablet Commonly known as:  FLEXERIL   Prenatal Complete 14-0.4 MG Tabs     TAKE these medications   [employer] PRENATAL GUMMY PO Take 2 tablets by mouth daily.   ferrous sulfate 325 (65 FE) MG tablet Take 1 tablet (325 mg total) by mouth 2 (two) times daily with a meal.   ibuprofen 800 MG tablet Commonly known as:  ADVIL Take 1 tablet (800 mg total) by mouth every 8 (eight) hours.   oxyCODONE 5 MG immediate release tablet Commonly known as:  Oxy IR/ROXICODONE Take 1-2 tablets (5-10 mg total) by mouth every 4 (four) hours as needed for moderate pain.       Postpartum contraception: Condoms Diet: Routine Diet Activity:  Advance as tolerated. Pelvic rest for 6 weeks.   Follow-up Appt:No future appointments. Follow-up Visit:No follow-ups on file. Please schedule this patient for Postpartum visit in: 4-6 weeks with the following provider: Any provider For C/S patients schedule nurse incision check in weeks 2 weeks: yes Low risk pregnancy complicated by: obesity Delivery mode:  CS Anticipated Birth Control:  other/unsure PP Procedures needed: Incision check  Schedule Integrated BH visit: no   Newborn Data: Live born female  Birth Weight:   APGAR: 4, 8  Newborn Delivery   Birth date/time:  07/25/2018 18:33:00 Delivery type:  C-Section, Low Transverse Trial of labor:  Yes C-section categorization:  Primary     Baby Feeding: Breast Disposition:home with mother

## 2018-07-25 NOTE — Progress Notes (Signed)
OB/GYN Faculty Practice: Labor Progress Note  Subjective: Into room to introduce self to patient. Reports doing well, states does feel pressure with contractions. Knew she had a fever but unaware of possible triple I.   RN and I discussed plan of care prior to my meeting patient. Fever improved but still with maternal and fetal tachycardia. Support person causing significant stress to mother, may or may not stay in room.   Objective: BP (!) 102/54   Pulse (!) 119   Temp 98.9 F (37.2 C) (Oral)   Resp 18   Ht 5' 2.5" (1.588 m)   Wt 100.2 kg   LMP 07/21/2017 Comment: bilghted ovum  SpO2 99%   BMI 39.78 kg/m  Gen: well-appearing, NAD Dilation: 6 Effacement (%): 90 Cervical Position: Middle Station: -1 Presentation: Vertex Exam by:: SRussell, RN   Assessment and Plan: 23 y.o. [id number] [redacted]w[redacted]d here with SROM, early labor.   Labor: SROM and early labor yesterday around 1100. Per sign-out from CNM, patient initially wanted to do more expectant management. Did have a FB yesterday evening which is now out and started pitocin around 2300 last night. IUPC also placed - has been intermittently adequate, slow to titrate pitocin recently because of new triple I. Will plan to recheck within next 2-4 hours or earlier depending on fetal status.  -- triple I: Tmax 101.3 around 0700 this morning with maternal and fetal tachycardia  give 1g of Tylenol, started on amp/gent, given total of 1L bolus - temp improving as well as HR  -- pain control: epidural  -- PPH Risk: medium   Fetal Well-Being: EFW 7-8lbs by [place]. Cephalic by sutures on prior checks.  -- Category I - continuous fetal monitoring - reviewed strip with RN, prior periods of fetal tachycardia now improving, occasional variables spontaneously resolved - trying position changes with peanut ball  -- GBS negative    Kerrick Miler S. Earlene Plater, DO OB/GYN Fellow, Faculty Practice  9:23 AM

## 2018-07-26 ENCOUNTER — Inpatient Hospital Stay (HOSPITAL_COMMUNITY): Payer: Medicaid Other

## 2018-07-26 ENCOUNTER — Encounter (HOSPITAL_COMMUNITY): Payer: Self-pay | Admitting: Obstetrics and Gynecology

## 2018-07-26 LAB — CBC
HCT: 29.7 % — ABNORMAL LOW (ref 36.0–46.0)
Hemoglobin: 9.8 g/dL — ABNORMAL LOW (ref 12.0–15.0)
MCH: 30.2 pg (ref 26.0–34.0)
MCHC: 33 g/dL (ref 30.0–36.0)
MCV: 91.4 fL (ref 80.0–100.0)
Platelets: 164 10*3/uL (ref 150–400)
RBC: 3.25 MIL/uL — ABNORMAL LOW (ref 3.87–5.11)
RDW: 14.1 % (ref 11.5–15.5)
WBC: 14.6 10*3/uL — ABNORMAL HIGH (ref 4.0–10.5)
nRBC: 0 % (ref 0.0–0.2)

## 2018-07-26 LAB — CREATININE, SERUM
Creatinine, Ser: 0.88 mg/dL (ref 0.44–1.00)
GFR calc Af Amer: >60 mL/min (ref >60)
GFR calc non Af Amer: >60 mL/min (ref >60)

## 2018-07-26 NOTE — Progress Notes (Signed)
Subjective: Postpartum Day 1: Cesarean Delivery Patient reports incisional pain and tolerating PO.    Objective: Vital signs in last 24 hours: Temp:  [97.7 F (36.5 C)-101.3 F (38.5 C)] 97.7 F (36.5 C) (05/11 0600) Pulse Rate:  [90-149] 90 (05/11 0600) Resp:  [13-31] 18 (05/11 0600) BP: (77-131)/(30-81) 108/70 (05/11 0600) SpO2:  [96 %-100 %] 97 % (05/11 0600)  Physical Exam:  General: alert, cooperative and no distress Lochia: appropriate Uterine Fundus: firm Incision: no significant drainage DVT Evaluation: No evidence of DVT seen on physical exam.  Recent Labs    07/24/18 1406 07/26/18 0448  HGB 12.8 9.8*  HCT 38.4 29.7*    Assessment/Plan: Status post Cesarean section. Doing well postoperatively.  Continue current care.  Scheryl Darter 07/26/2018, 6:53 AM

## 2018-07-26 NOTE — Addendum Note (Signed)
Addendum  created 07/26/18 1614 by Kaylyn Layer, MD   Intraprocedure Staff edited

## 2018-07-26 NOTE — Progress Notes (Signed)
CSW received consult for history of anxiety and depression with a history of overdose.  CSW met with MOB to offer support and complete assessment.    MOB pumping with curtain drawn, when CSW entered the room. CSW introduced self and offered to come back at a later time but MOB requested CSW stay to complete assessment. MOB covered self up and allowed CSW to draw back the curtain. FOB also present at bedside and doing skin-to-skin with infant. CSW explained reason for consult and received verbal permission to discuss anything in front of FOB. CSW inquired about MOB's mental health history and MOB acknowledged having a history of anxiety and depression dating back to when she was 15-years-old. MOB stated at the time she had miscarried and "went down a rabbit-hole". MOB reported experiencing some "random spells of sadness" since then but reported it was nothing as bad as before. MOB shared using coping skills such as deep-breathing, walking and cleaning. MOB stated she was prescribed medications and saw a therapist for a few months in high school but nothing since. MOB shared that besides being tired she feels ecstatic. MOB denied any current mental health symptoms but was receptive to PMADs education. CSW provided education regarding the baby blues period vs. perinatal mood disorders, discussed treatment and gave resources for mental health follow up if concerns arise.  CSW recommended self-evaluation during the postpartum time period using the New Mom Checklist from Postpartum Progress and encouraged MOB to contact a medical professional if symptoms are noted at any time. MOB denied any current SI or HI and reported feeling well supported by FOB, her mother, her sister and their roommate.  MOB confirmed having all essential items for infant once discharged. MOB reported infant would be sleeping in a basinet once home. CSW provided review of Sudden Infant Death Syndrome (SIDS) precautions and safe sleeping habits.      CSW identifies no further need for intervention and no barriers to discharge at this time.   [place], LCSWA  [facility] [phone]   

## 2018-07-26 NOTE — Lactation Note (Addendum)
This note was copied from a baby's chart. Lactation Consultation Note  Patient Name: Cheryl Henry [id number] Date: 07/26/2018 Reason for consult: Follow-up assessment;Primapara;1st time breastfeeding;Term  P1 mother whose infant is now 33 hours old.  Baby was STS on father's chest when I arrived and not showing feeding cues.  Discussed basic breast feeding concepts and pumping with mother.  It had been 6 hours since she last used the DEBP.  Discussed the importance of pumping every 3 hours and to continue hand expression before/after pumping to help increase milk supply.  Offered to review pump and observe pumping and mother accepted.  Mother's breasts are large, soft and non tender and nipples are flat.  Mother has breast shells at bedside and a manual pump that she is using to help evert nipples.  She stated she also has a #20 NS that she has no difficulty using and that baby has had one good feeding using the NS since birth.  Since that time mother stated baby has not been interested in feeding.  Reassured mother that this is typical behavior for a baby at this age.  Reviewed feeding cues and suggested ways that mother can help arouse baby prior to attempting a latch.  Mother appreciative of suggestions.    Set up and reviewed DEBP for mother.  #24 flange size is appropriate at this time.  Discussed supply and demand, using colostrum as lubricant for nipple comfort, hand expression, voids/stools and how to call RN/LC for assistance.  While speaking with mother I observed a drop of colostrum to the left breast.  Showed parents how to finger feed and to be sure that baby obtains all colostrum drops mother can express.  Mother will call for latch assistance as needed.  Father supportive and engaged and both parents very receptive to learning. Mother does not have a DEBP for home use. WIC referral placed and RN updated.   Maternal Data Formula Feeding for Exclusion: No Has patient been taught Hand  Expression?: Yes Does the patient have breastfeeding experience prior to this delivery?: No  Feeding Feeding Type: Breast Fed  LATCH Score                   Interventions    Lactation Tools Discussed/Used WIC Program: No(Mother plans to apply; referral sent) Pump Review: Setup, frequency, and cleaning Initiated by:: Laureen Ochs (reviewed)   Consult Status Consult Status: Follow-up Date: 07/27/18 Follow-up type: In-patient    Cheryll Keisler R Anthone Prieur 07/26/2018, 8:59 AM

## 2018-07-27 MED ORDER — FERROUS SULFATE 325 (65 FE) MG PO TABS
325.0000 mg | ORAL_TABLET | Freq: Two times a day (BID) | ORAL | Status: DC
  Administered 2018-07-27 – 2018-07-28 (×3): 325 mg via ORAL
  Filled 2018-07-27 (×3): qty 1

## 2018-07-27 MED ORDER — DOCUSATE SODIUM 100 MG PO CAPS: 100.0000 mg | ORAL_CAPSULE | Freq: Two times a day (BID) | ORAL | Status: DC | PRN

## 2018-07-27 NOTE — Lactation Note (Signed)
This note was copied from a baby's chart. Lactation Consultation Note Baby 51 hrs old at time of consult. Baby had only had per mom 2 voids and 3 stools. Discussed supplementing w/mom. Mom has been supplementing w/Gerber formula. Mom not pumping much. Mom states she doesn't have anything coming out w/pumping or hand expression. LC attempted to explained again as several others has tried to get mom to understand she needs stimulation. Mom told LC that she for now is going to formula feed. Mom states she will pump some and when she gets home she will try to still BF some. LC explained the importance of mom getting the assistance she needs for BF while she's here if she's going to BF when she goes home.   LC reviewed feeding amount for supplementing verses amount for just formula feeding. Mom states understands. Reported to RN.  Patient Name: Cheryl Henry [id number]'V Date: 07/27/2018 Reason for consult: Follow-up assessment;1st time breastfeeding;Term   Maternal Data    Feeding Feeding Type: Formula  LATCH Score                   Interventions    Lactation Tools Discussed/Used     Consult Status Consult Status: Follow-up Date: 07/28/18 Follow-up type: In-patient    [name] 07/27/2018, 11:22 PM

## 2018-07-27 NOTE — Lactation Note (Signed)
This note was copied from a baby's chart. Lactation Consultation Note Baby 31 hrs old. Baby in crib cueing, suckling on fingers, moving around in bed. Discussed feeding cues. Handed mom baby.  LC discussed baby should be cluster feeding at hours of age wanting to eat every hour. Mom stated she has been wanting to eat every hour or so. Encouraged mom to put baby to breast when cues.  Mom has very large breast, flat nipples. Semi compressible. Mom states she uses NS. Mom states she pre=pumps before applying NS. Mom states colostrum in NS. Mom stated she doesn't know how much the baby is getting.  LC explained that is why I&O is important.  Discussed feeding positions. LC placed pillows at bedside. Discussed support, safety and comfort while BF.  Assisted baby to breast. Baby has nasal congestion. Snorting loudly.  Called RN to notify MD. Pecola Leisure has increased resp. At breast. Encouraged to feed baby w/head upright so baby can breathe better.  Will make sure I&O updated.  Patient Name: Cheryl Henry [id number] Date: 07/27/2018 Reason for consult: Follow-up assessment;1st time breastfeeding   Maternal Data    Feeding Feeding Type: Breast Fed  LATCH Score Latch: Grasps breast easily, tongue down, lips flanged, rhythmical sucking.  Audible Swallowing: None  Type of Nipple: Flat  Comfort (Breast/Nipple): Soft / non-tender  Hold (Positioning): Assistance needed to correctly position infant at breast and maintain latch.  LATCH Score: 6  Interventions Interventions: Breast feeding basics reviewed;Adjust position;Assisted with latch;Support pillows;Skin to skin;Position options;Breast massage;Pre-pump if needed;Reverse pressure;Breast compression;Hand pump  Lactation Tools Discussed/Used Shell Type: (mom stated she has shells/strongly encouraged to wear.)   Consult Status Consult Status: Follow-up Date: 07/27/18 Follow-up type: In-patient    Cheryl Henry, [name] 07/27/2018, 2:15  AM

## 2018-07-27 NOTE — Lactation Note (Signed)
This note was copied from a baby's chart. Lactation Consultation Note Baby has only had 1 void and 1 stool in 32 hrs of life.\ Hand expressed w/1 drop of thick colostrum.  Mom stated baby BF 15 min. NS clean all but 1 sm. Dot of colostrum. LC encouraged mom pumping after baby's breathing calms. Baby has labored breathing using accessory muscles. RN notified. RN placed baby on pulse ox.  Mom in agreement to supplement baby. LC used curve tip syring and finger fed 3 ml Gerber formula. Baby suckled some then started clamping, then tongue thrusting. O2 sats. Dropped to 79-89. Baby passing gas. O2 sats. Back to normal.   Mom holding baby supine w/head upright on mom's breast. Baby would no longer suckle on gloved finger. LC discussed milk storage and syring feeding.  LC took #24 NS for next feeding. Mom to call for fitting and assisting latch.  Patient Name: Cheryl Henry Date: 07/27/2018 Reason for consult: Follow-up assessment;1st time breastfeeding   Maternal Data    Feeding Feeding Type: Formula  LATCH Score Latch: Grasps breast easily, tongue down, lips flanged, rhythmical sucking.  Audible Swallowing: None  Type of Nipple: Flat  Comfort (Breast/Nipple): Soft / non-tender  Hold (Positioning): Assistance needed to correctly position infant at breast and maintain latch.  LATCH Score: 6  Interventions Interventions: Breast feeding basics reviewed;Adjust position;Assisted with latch;Support pillows;Skin to skin;Position options;Breast massage;Pre-pump if needed;Reverse pressure;Breast compression;Hand pump  Lactation Tools Discussed/Used Shell Type: (mom stated she has shells/strongly encouraged to wear.)   Consult Status Consult Status: Follow-up Date: 07/27/18 Follow-up type: In-patient    Lane Eland, [name] 07/27/2018, 3:02 AM

## 2018-07-27 NOTE — Lactation Note (Signed)
This note was copied from a baby's chart. Lactation Consultation Note  Patient Name: Cheryl Henry Date: 07/27/2018 Reason for consult: Follow-up assessment;Term;Primapara;1st time breastfeeding;Difficult latch  P1 mother whose infant is now 55 hours old.    Baby was awake and ready to feed when I arrived.  Offered to assist with latching and mother accepted.    Mother's breasts are large, soft and non tender and nipples are flat.  Mother has breast shells at bedside which I suggested wearing yesterday but she has not yet done this.  Reviewed hand expression and mother was unable to express any colostrum drops.  She has not been pumping regularly which was also discussed yesterday.  I stressed the importance of pumping every three hours after feedings but mother has not done this.  Asked mother to demonstrate placing the NS and she required further education on proper placement.  Once she learned the technique she was able to place it correctly.  Assisted baby to latch onto the left breast in the football hold.  Demonstrated how baby needs to have a wide gape and lips to base of NS.  She was sleepy at the breast and needed stimulation to begin sucking.  After a few minutes I removed her from the breast and there was no colostrum in the NS.  Since baby needs to obtain calories and mother is supplementing with Daron Offer I offered to assist with putting formula in the NS to help baby latch and feed better at the breast.  This technique was demonstrated and baby easily sucked the formula from the NS tip.  Asked father to observe since I would be expecting him to help mother at the next feeding.  Explained in detail the procedure.  Father observed.  Mother felt the baby sucking but denied pain.  She stated this was the first time she felt a good "pull".   Burped baby well after she had taken 10 mls.  Attempted to latch again but she would not open her mouth.  Fed her an additional 4 mls using  the curved tip syringe.  Demonstrated and instructed in this technique also so parents  Could perform this on their own with the next feeding.  Baby content and sleepy after this feeding.  Reviewed feeding supplementation guidelines with parents.    Set mother up with the DEBP again and reviewed pump and settings.  Informed her that she will need to pump for 15 minutes after every feeding to help stimulate milk production.  She will feed back any EBM she obtains with pumping to baby prior to giving formula supplement.  Informed parents to feed again at 1330 or earlier if baby shows cues.  Parents verbalized understanding.  RN updated.   Maternal Data Formula Feeding for Exclusion: No Has patient been taught Hand Expression?: Yes Does the patient have breastfeeding experience prior to this delivery?: No  Feeding Feeding Type: Breast Fed  LATCH Score Latch: Repeated attempts needed to sustain latch, nipple held in mouth throughout feeding, stimulation needed to elicit sucking reflex.(Used NS with formula in NS tip)  Audible Swallowing: A few with stimulation  Type of Nipple: Flat  Comfort (Breast/Nipple): Soft / non-tender  Hold (Positioning): Assistance needed to correctly position infant at breast and maintain latch.  LATCH Score: 6  Interventions Interventions: Breast feeding basics reviewed;Assisted with latch;Skin to skin;Breast massage;Hand express;Pre-pump if needed;Breast compression;Hand pump;Shells;Position options;Support pillows;Adjust position;DEBP  Lactation Tools Discussed/Used Tools: Shells;Pump;Nipple Shields Nipple shield size: 20 Shell  Type: Inverted Breast pump type: Double-Electric Breast Pump;Manual WIC Program: No(Mother plans to apply; [facility] Pump Review: Setup, frequency, and cleaning(Reviewed)   Consult Status Consult Status: Follow-up Date: 07/28/18 Follow-up type: In-patient    Jude Naclerio R Brogan England 07/27/2018, 11:13 AM

## 2018-07-27 NOTE — Progress Notes (Addendum)
Postpartum Day 2: Cesarean Delivery Subjective: Patient reports incisional pain and tolerating PO.  Feels it is hard to get out of bed due to pain?  Reports flatus and bowel movement.    Objective: Vital signs in last 24 hours: Temp:  [97.6 F (36.4 C)-98.4 F (36.9 C)] 97.6 F (36.4 C) (05/12 0504) Pulse Rate:  [83-99] 83 (05/12 0504) Resp:  [18] 18 (05/12 0504) BP: (91-107)/(54-68) 98/54 (05/12 0504) SpO2:  [97 %-99 %] 97 % (05/11 1808)  Physical Exam:  General: alert, cooperative and no distress Lochia: appropriate Uterine Fundus: firm Incision: no significant drainage DVT Evaluation: No evidence of DVT seen on physical exam.  Recent Labs    07/24/18 1406 07/26/18 0448  HGB 12.8 9.8*  HCT 38.4 29.7*    Assessment/Plan: Status post Cesarean section. Doing well postoperatively.  Ferrous sulfate added to regimen Will give pain medication as needed. May decide to go home later Continue current care.  Geraldine Sandberg 07/27/2018, 8:21 AM

## 2018-07-27 NOTE — Lactation Note (Signed)
This note was copied from a baby's chart. Lactation Consultation Note  Patient Name: Cheryl Henry [id number] Date: 07/27/2018    [facility] Follow Up Visit:  Mother sleeping; father will call when mother awakens.    Tawonna Esquer R Maizey Menendez 07/27/2018, 9:59 AM

## 2018-07-28 MED ORDER — FERROUS SULFATE 325 (65 FE) MG PO TABS: 325.0000 mg | ORAL_TABLET | Freq: Two times a day (BID) | ORAL | 3 refills | Status: AC

## 2018-07-28 MED ORDER — OXYCODONE HCL 5 MG PO TABS: 5.0000 mg | ORAL_TABLET | ORAL | 0 refills | Status: DC | PRN

## 2018-07-28 MED ORDER — IBUPROFEN 800 MG PO TABS: 800.0000 mg | ORAL_TABLET | Freq: Three times a day (TID) | ORAL | 0 refills | Status: DC

## 2018-07-28 NOTE — Discharge Instructions (Signed)

## 2018-07-28 NOTE — Lactation Note (Addendum)
This note was copied from a baby's chart. Lactation Consultation Note  Patient Name: Cheryl Henry CWCBJ'S Date: 07/28/2018 Reason for consult: Follow-up assessment;Difficult latch Baby is 62 hours old/6% weight loss.  Mom has been putting baby to breast with nipple shield and supplementing after with formula.  Assisted with positioning baby in cross cradle hold with 20 mm nipple shield.  Baby latched well after a few attempts.  Baby stayed active with few swallows noted.  Discussed milk coming to volume and the prevention and treatment of engorgement.  Instructed to put baby to breast with feeding cues.  If baby still showing cues after breastfeeding supplement with formula.  Mom encouraged to post pump.  She will take manual pump home and plans on getting a DEBP.  Reviewed outpatient services and support and encouraged to call prn.  Maternal Data    Feeding Feeding Type: Breast Fed  LATCH Score Latch: Grasps breast easily, tongue down, lips flanged, rhythmical sucking.(with nipple shield)  Audible Swallowing: A few with stimulation  Type of Nipple: Everted at rest and after stimulation  Comfort (Breast/Nipple): Soft / non-tender  Hold (Positioning): Assistance needed to correctly position infant at breast and maintain latch.  LATCH Score: 8  Interventions Interventions: Assisted with latch;Breast compression;Skin to skin;Adjust position;Breast massage;Support pillows  Lactation Tools Discussed/Used Tools: Nipple Shields Nipple shield size: 20   Consult Status Consult Status: Complete Follow-up type: Call as needed    Huston Foley 07/28/2018, 9:17 AM

## 2018-08-11 ENCOUNTER — Ambulatory Visit: Payer: Medicaid Other

## 2018-08-16 ENCOUNTER — Telehealth: Payer: Self-pay

## 2018-08-16 NOTE — Telephone Encounter (Signed)
Attempted to reach Cheryl Henry again to ask about the missed appointment. Did not get an answer, and there was no answering machine.

## 2018-08-17 ENCOUNTER — Telehealth: Payer: Self-pay

## 2018-08-17 NOTE — Telephone Encounter (Signed)
Pt called stating that she was returning a missed call.  Called pt unable to LM to VM box full.  MyChart message sent to pt.

## 2018-08-30 ENCOUNTER — Telehealth: Payer: Self-pay | Admitting: Advanced Practice Midwife

## 2018-08-30 NOTE — Telephone Encounter (Signed)
Called the patient to confirm upcoming virtual appointment. The patient verbalized understanding and has the app downloaded. °

## 2018-08-31 ENCOUNTER — Telehealth (INDEPENDENT_AMBULATORY_CARE_PROVIDER_SITE_OTHER): Payer: Medicaid Other

## 2018-08-31 ENCOUNTER — Other Ambulatory Visit: Payer: Self-pay

## 2018-08-31 NOTE — Progress Notes (Signed)
I connected with  Cheryl Henry on 08/31/18 at  8:55 AM EDT by telephone and verified that I am speaking with the correct person using two identifiers.   I discussed the limitations, risks, security and privacy concerns of performing an evaluation and management service by telephone and the availability of in person appointments. I also discussed with the patient that there may be a patient responsible charge related to this service. The patient expressed understanding and agreed to proceed.  Aviva Signs [name], CMA 08/31/2018  8:46 AM   6 weeks PP Primary Cesarean  Spinal Unremarkable for mom and baby Bottle fed formula Gerber good start Bowel ok Bladder ok Not Sexually Active No Contraceptive Edinburgh *Negative

## 2018-08-31 NOTE — Progress Notes (Signed)
TELEHEALTH VIRTUAL POSTPARTUM VISIT ENCOUNTER NOTE  I connected with Cheryl Henry on 08/31/18 at  8:55 AM EDT by MyChart Virtual Visit at home and verified that I am speaking with the correct person using two identifiers.   I discussed the limitations, risks, security and privacy concerns of performing an evaluation and management service by telephone and the availability of in person appointments. I also discussed with the patient that there may be a patient responsible charge related to this service. The patient expressed understanding and agreed to proceed.  Appointment Date: 08/31/2018  [facility]  Chief Complaint:  Chief Complaint  Patient presents with  . Postpartum Care    History of Present Illness: Cheryl EpleySarah D Henry is a 23 y.o. Caucasian G3P0020 (Patient's last menstrual period was 07/21/2017.), seen for the above chief complaint. Her past medical history is significant for asthma and overdose   She is s/p primary cesarean section on 5/10 at 40 weeks; she was discharged to home on PPD#3. Pregnancy complicated by arrest of dilation and chorioamnionitis. Baby is doing well.  Complains of n/a  Vaginal bleeding or discharge: No  Mode of feeding infant: Bottle Intercourse: No  Contraception: no method PP depression s/s: No .  Any bowel or bladder issues: No  Pap smear: no abnormalities (date: 10/19)  Review of Systems: Positive for n/a. Her 12 point review of systems is negative or as noted in the History of Present Illness.  Patient Active Problem List   Diagnosis Date Noted  . PROM (premature rupture of membranes) 07/25/2018  . Chorioamnionitis 07/25/2018  . Encounter for induction of labor 07/24/2018  . Supervision of other normal pregnancy, antepartum 01/07/2018  . 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 Acetaminophen overdose 06/15/2012  . Phenytoin overdose 06/15/2012  . Asthma 10/12/2008    Medications Cheryl Henry had no medications  administered during this visit. Current Outpatient Medications  Medication Sig Dispense Refill  . ferrous sulfate 325 (65 FE) MG tablet Take 1 tablet (325 mg total) by mouth 2 (two) times daily with a meal. (Patient not taking: Reported on 08/31/2018) 60 tablet 3  . ibuprofen (ADVIL) 800 MG tablet Take 1 tablet (800 mg total) by mouth every 8 (eight) hours. (Patient not taking: Reported on 08/31/2018) 30 tablet 0  . oxyCODONE (OXY IR/ROXICODONE) 5 MG immediate release tablet Take 1-2 tablets (5-10 mg total) by mouth every 4 (four) hours as needed for moderate pain. (Patient not taking: Reported on 08/31/2018) 24 tablet 0  . Prenatal MV & Min w/FA-DHA ([employer] PRENATAL GUMMY PO) Take 2 tablets by mouth daily.     No current facility-administered medications for this visit.     Allergies Prednisone  Physical Exam:  General:  Alert, oriented and cooperative.   Mental Status: Normal mood and affect perceived. Normal judgment and thought content.  Rest of physical exam deferred due to type of encounter  PP Depression Screening:   Edinburgh Postnatal Depression Scale - 08/31/18 0849      Edinburgh Postnatal Depression Scale:  In the Past 7 Days   I have been able to laugh and see the funny side of things.  0    I have looked forward with enjoyment to things.  0    I have blamed myself unnecessarily when things went wrong.  0    I have been anxious or worried for no good reason.  0    I have felt scared or panicky for no good reason.  0    Things have been getting on top of me.  0    I have been so unhappy that I have had difficulty sleeping.  0    I have felt sad or miserable.  0    I have been so unhappy that I have been crying.  1    The thought of harming myself has occurred to me.  0    Edinburgh Postnatal Depression Scale Total  1       Assessment:Patient is a 23 y.o. G3P0020 who is 6 weeks postpartum from a normal spontaneous vaginal delivery.  She is doing well.   Plan:  1.  Postpartum care and examination -Patient missed appointment for incision check and had an area of concern. CNM able to view incision virtually. Incision healing appropriately with no signs of infection. Area of concern is stretch mark near incision. Reassurance provided to patient.  -Patient declines birth control at this time. Wants to do more research before deciding. Counseled patient on options and reviewed need for barrier method if she desires to prevent pregnancy during this time. Will schedule appointment for contraception when ready. -Discussed normalcy of "roller coaster of emotions" in postpartum period. Patient states mood is improving and has good support at home. Declines visit with Roselyn Reef today but states she will reach out if needed.    RTC 1 year for annual exam or sooner if needed for contraception  I discussed the assessment and treatment plan with the patient. The patient was provided an opportunity to ask questions and all were answered. The patient agreed with the plan and demonstrated an understanding of the instructions.   The patient was advised to call back or seek an in-person evaluation/go to the ED for any concerning postpartum symptoms.  I provided 25 minutes of non-face-to-face time during this encounter.   Wende Mott, [place] for [employer], [place]

## 2019-03-09 IMAGING — US US OB COMP LESS 14 WK
1 series · 14 of 28 positions shown · non-contrast
Comparison: None.

CLINICAL DATA: Abdominal and pelvic pain and cramping. Recent
miscarriage approximately 2 months ago.

EXAM:
OBSTETRIC <14 WK US AND TRANSVAGINAL OB US
TECHNIQUE: Both transabdominal and transvaginal ultrasound examinations were
performed for complete evaluation of the gestation as well as the
maternal uterus, adnexal regions, and pelvic cul-de-sac.
Transvaginal technique was performed to assess early pregnancy.

[Series 1: us ob comp less 14 wk · 14 of 84 slices shown]
[im 4/84]
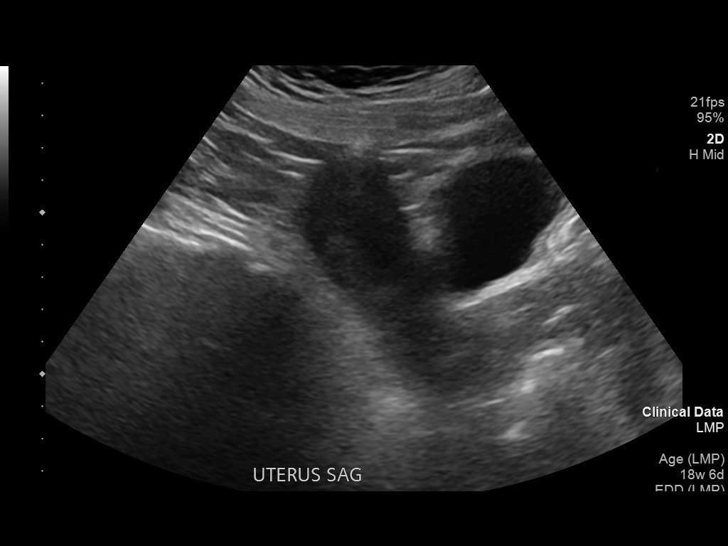
[im 10/84]
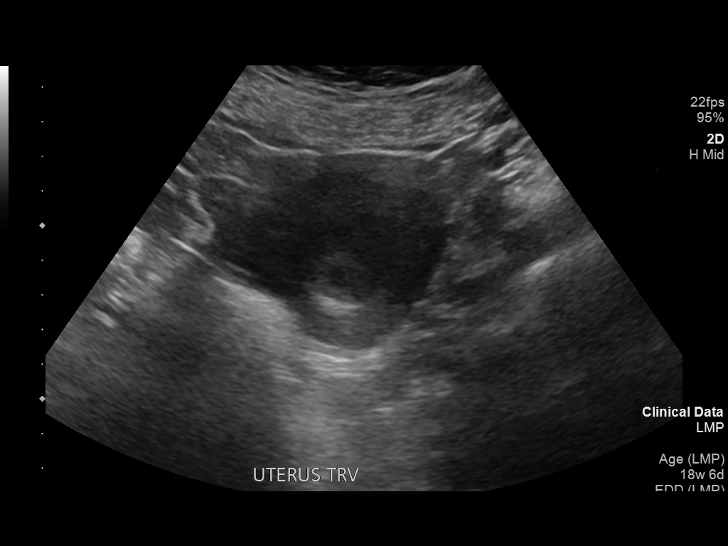
[im 16/84]
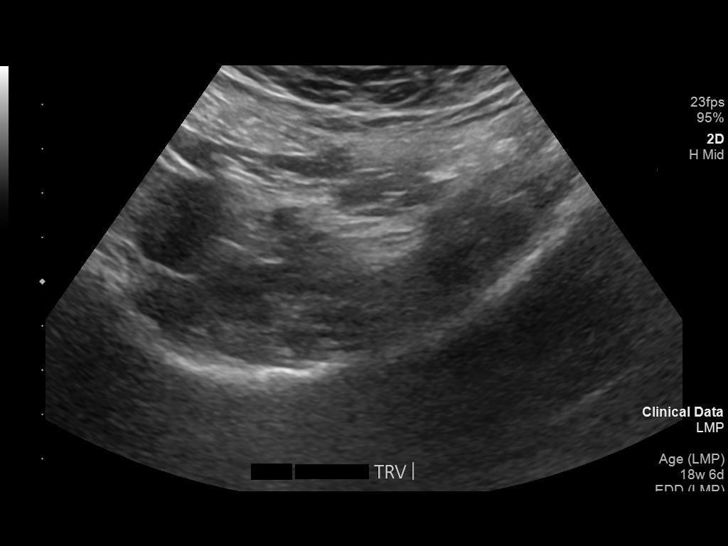
[im 22/84]
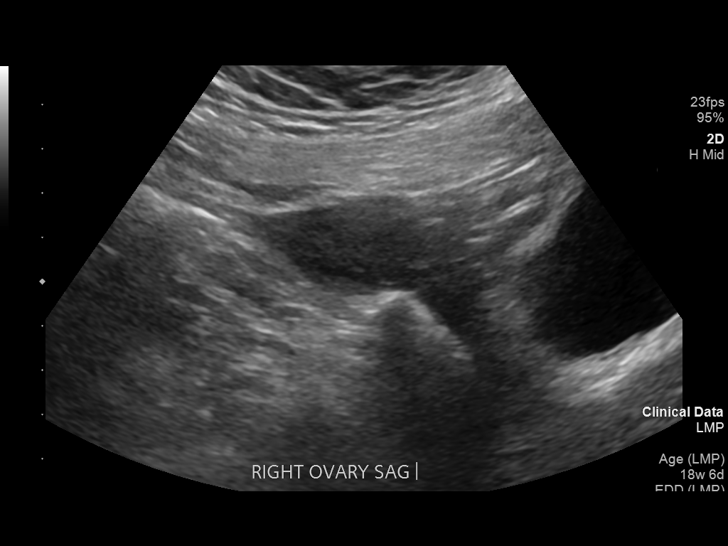
[im 28/84]
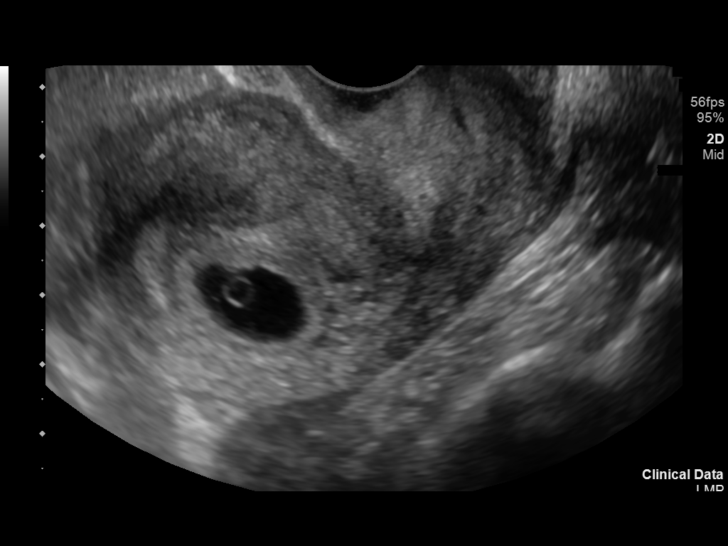
[im 34/84]
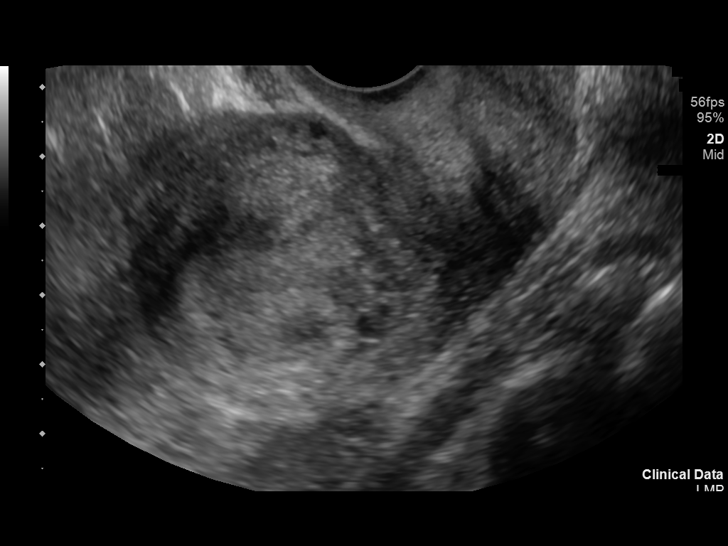
[im 40/84]
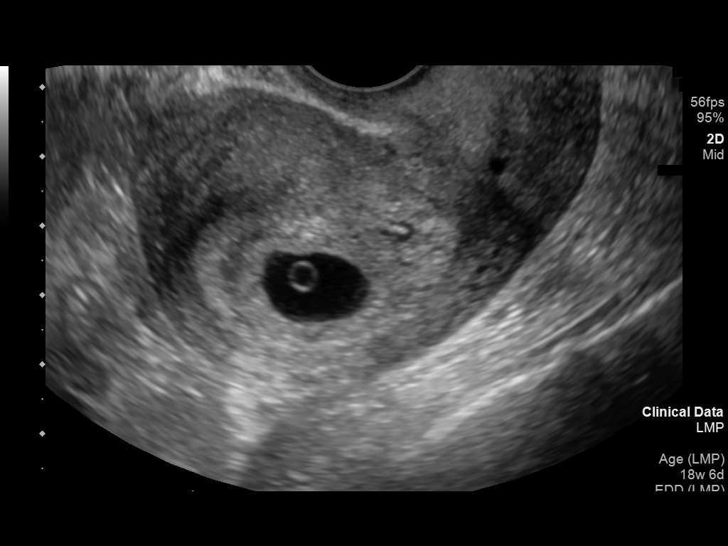
[im 47/84]
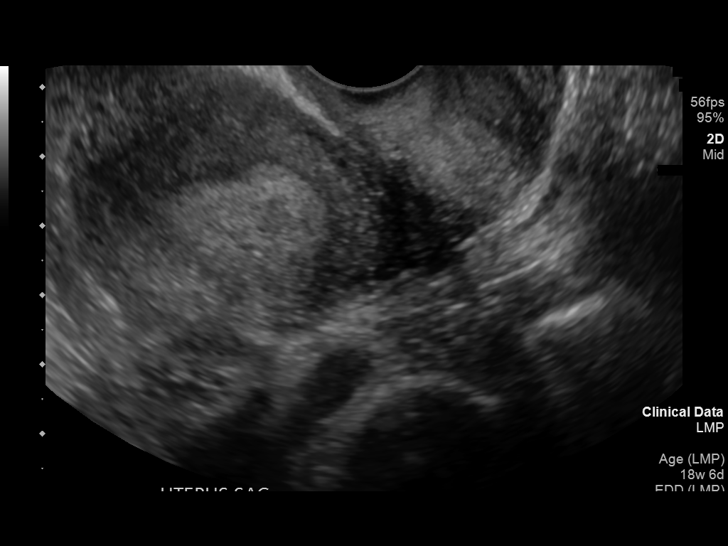
[im 53/84]
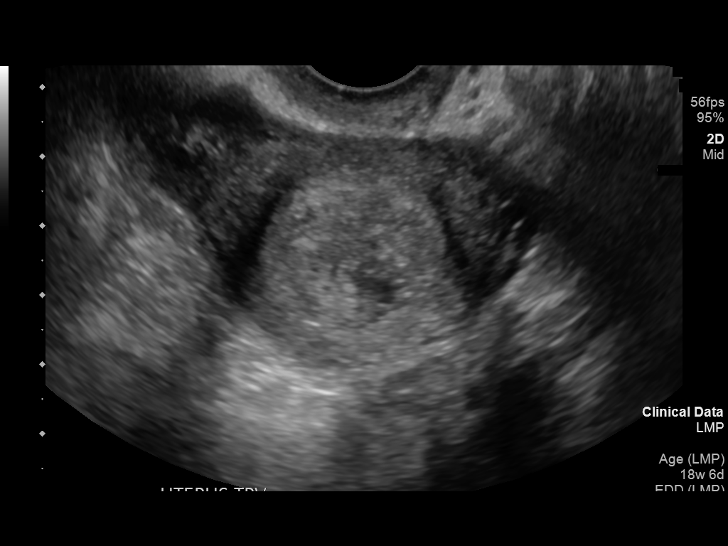
[im 59/84]
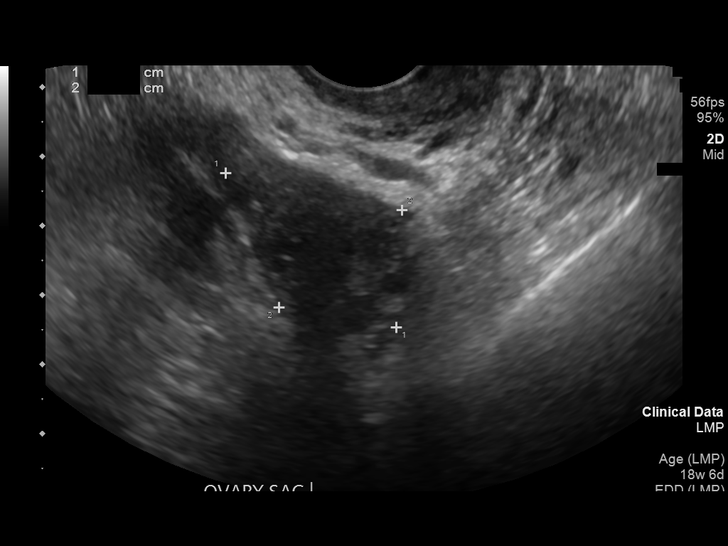
[im 65/84]
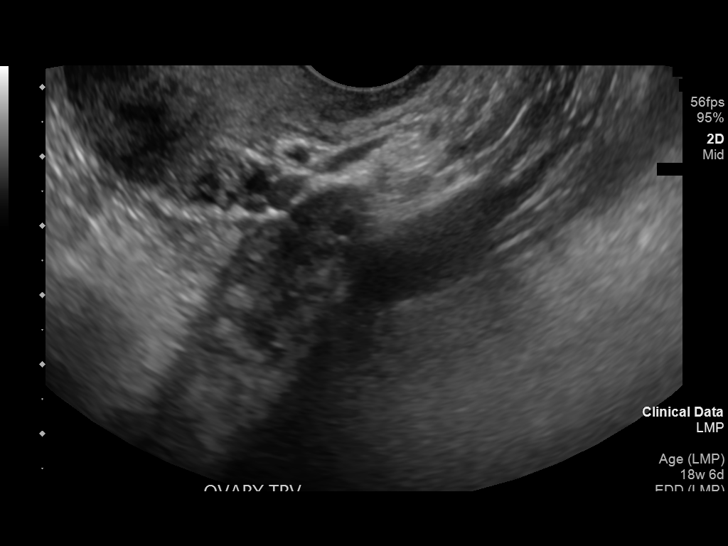
[im 71/84]
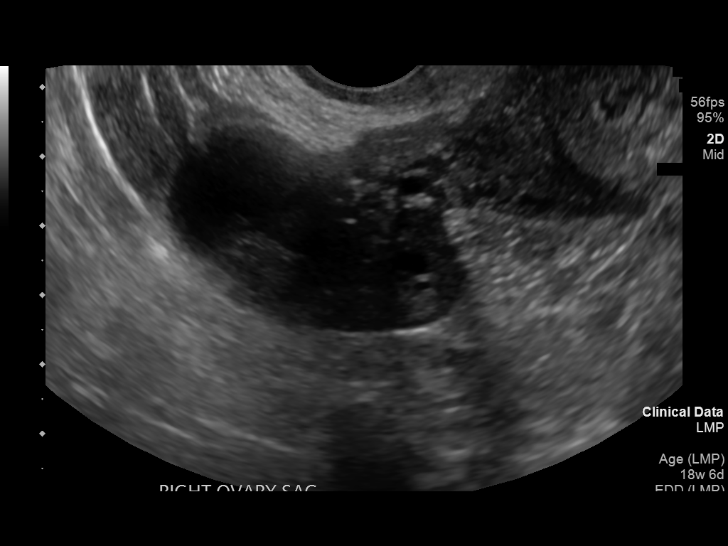
[im 77/84]
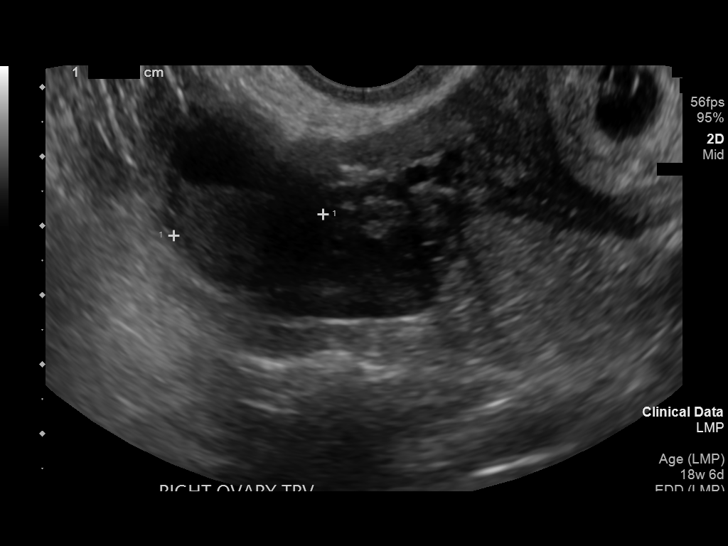
[im 84/84]
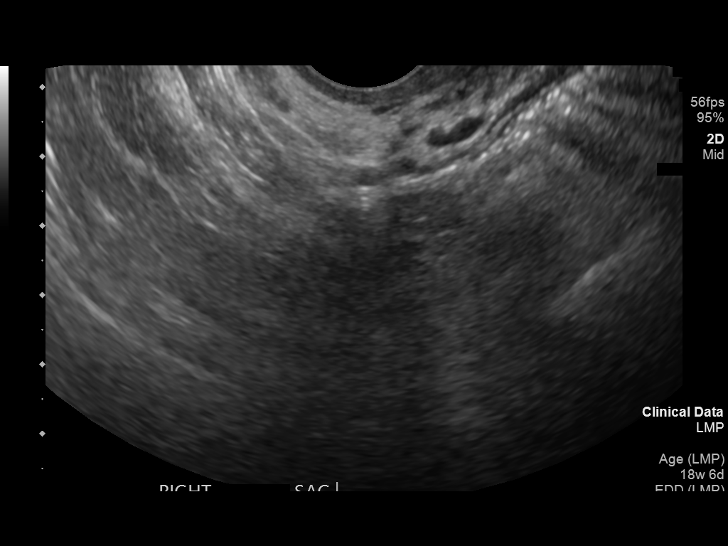

[14 of 28 positions shown; findings below may reference images not displayed]

FINDINGS: Intrauterine gestational sac: Single

Yolk sac:  Visualized.

Embryo:  Visualized.

Cardiac Activity: Visualized.

Heart Rate: 112 bpm

CRL:  4 mm   6 w   1 d                  US EDC: 07/19/2018

Subchorionic hemorrhage:  None visualized.

Maternal uterus/adnexae: Small right ovarian corpus luteum cyst
noted. Normal appearance of left ovary. No mass or abnormal free
fluid identified.
IMPRESSION: Single living IUP measuring 6 weeks 1 day, with US EDC of
07/19/2018.

No significant maternal uterine or adnexal abnormality identified.

## 2019-05-04 ENCOUNTER — Other Ambulatory Visit: Payer: Self-pay

## 2019-05-04 ENCOUNTER — Encounter (HOSPITAL_BASED_OUTPATIENT_CLINIC_OR_DEPARTMENT_OTHER): Payer: Self-pay

## 2019-05-04 ENCOUNTER — Emergency Department (HOSPITAL_BASED_OUTPATIENT_CLINIC_OR_DEPARTMENT_OTHER)
Admission: EM | Admit: 2019-05-04 | Discharge: 2019-05-04 | Disposition: A | Discharge status: Home / Self Care | Payer: Medicaid Other | Attending: Emergency Medicine | Admitting: Emergency Medicine

## 2019-05-04 DIAGNOSIS — Z888 Allergy status to other drugs, medicaments and biological substances status: Secondary | ICD-10-CM | POA: Insufficient documentation

## 2019-05-04 DIAGNOSIS — Z79899 Other long term (current) drug therapy: Secondary | ICD-10-CM | POA: Insufficient documentation

## 2019-05-04 DIAGNOSIS — Z87891 Personal history of nicotine dependence: Secondary | ICD-10-CM | POA: Diagnosis not present

## 2019-05-04 DIAGNOSIS — R1084 Generalized abdominal pain: Secondary | ICD-10-CM | POA: Diagnosis not present

## 2019-05-04 DIAGNOSIS — N939 Abnormal uterine and vaginal bleeding, unspecified: Secondary | ICD-10-CM | POA: Insufficient documentation

## 2019-05-04 LAB — PREGNANCY, URINE: Preg Test, Ur: NEGATIVE

## 2019-05-04 NOTE — ED Triage Notes (Addendum)
Pt c/o vaginal "spotting" started last night-stopped at 12pm-pos home preg test-pt reports LMP ~2.5 weeks ago

## 2019-05-04 NOTE — ED Provider Notes (Signed)
[facility] EMERGENCY DEPARTMENT Provider Note   CSN: [id number] Arrival date & time: 05/04/19  1420     History Chief Complaint  Patient presents with  . Vaginal Bleeding    Cheryl Henry is a 24 y.o. female G3P0020 with past medical significant for anxiety presents to emergency department today with chief complaint of intermittent abdominal cramping x 1 day. She also reports light vaginal bleeding, describes as light brown spotting that lasted 2 days, from 2/10-2/12. No bleeding or abdominal pain currently. She took 7 pregnancy tests at home, 4 were faintly positive and 3 negative. Her LMP was 2.5 weeks ago. She is sexually active with 1 female partner. She denies any fever, chills, chest pain, gross hematuria, urinary frequency, vaginal discharge, diarrhea.  She did not take any medications for her symptoms prior to arrival. Denies abdominal surgical history.  History provided by patient with additional history obtained from chart review.     Past Medical History:  Diagnosis Date  . Anxiety   . Asthma    as child   . Depression    doing ok  . Infection    UTI    Patient Active Problem List   Diagnosis Date Noted  . 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 Acetaminophen overdose 06/15/2012  . Phenytoin overdose 06/15/2012  . Asthma 10/12/2008    Past Surgical History:  Procedure Laterality Date  . CESAREAN SECTION N/A 07/25/2018   Procedure: CESAREAN SECTION;  Surgeon: Hermina Staggers, MD;  Location: [facility] LD ORS;  Service: Obstetrics;  Laterality: N/A;  . MYRINGOTOMY WITH TUBE PLACEMENT    . TONSILLECTOMY       OB History    Gravida  3   Para  0   Term  0   Preterm  0   AB  2   Living  0     SAB  2   TAB  0   Ectopic  0   Multiple  0   Live Births  0           Family History  Problem Relation Age of Onset  . Bipolar disorder Sister   . Heart disease Maternal Grandmother        chf  . Diabetes Maternal Grandmother    . Heart disease Maternal Grandfather     Social History   Tobacco Use  . Smoking status: Former Smoker    Types: Cigarettes  . Smokeless tobacco: Never Used  Substance Use Topics  . Alcohol use: Not Currently  . Drug use: No    Home Medications Prior to Admission medications   Medication Sig Start Date End Date Taking? Authorizing Provider  ferrous sulfate 325 (65 FE) MG tablet Take 1 tablet (325 mg total) by mouth 2 (two) times daily with a meal. Patient not taking: Reported on 08/31/2018 07/28/18   Hermina Staggers, MD  ibuprofen (ADVIL) 800 MG tablet Take 1 tablet (800 mg total) by mouth every 8 (eight) hours. Patient not taking: Reported on 08/31/2018 07/28/18   Hermina Staggers, MD  oxyCODONE (OXY IR/ROXICODONE) 5 MG immediate release tablet Take 1-2 tablets (5-10 mg total) by mouth every 4 (four) hours as needed for moderate pain. Patient not taking: Reported on 08/31/2018 07/28/18   Hermina Staggers, MD  Prenatal MV & Min w/FA-DHA ([employer] PRENATAL GUMMY PO) Take 2 tablets by mouth daily.    [provider]    Allergies    Prednisone  Review of Systems  Review of Systems  All other systems are reviewed and are negative for acute change except as noted in the HPI.  Physical Exam Updated Vital Signs BP 99/69 (BP Location: Right Arm)   Pulse 97   Temp 98.2 F (36.8 C) (Oral)   Resp 16   Ht 5' 1.5" (1.562 m)   Wt 103.1 kg   SpO2 99%   BMI 42.25 kg/m   Physical Exam    ED Results / Procedures / Treatments   Labs (all labs ordered are listed, but only abnormal results are displayed) Labs Reviewed  PREGNANCY, URINE    EKG None  Radiology No results found.  Procedures Procedures (including critical care time)  Medications Ordered in ED Medications - No data to display  ED Course  I have reviewed the triage vital signs and the nursing notes.  Pertinent labs & imaging results that were available during my care of the patient were reviewed by me  and considered in my medical decision making (see chart for details).    MDM Rules/Calculators/A&P                      Patient seen and examined. Patient presents awake, alert, hemodynamically stable, afebrile, non toxic.  She was noted to be tachycardic in triage to 115.  During my exam heart rate ranges from 90-97.  She has no abdominal tenderness, no peritoneal signs.  No CVA tenderness. Urine pregnancy test was collected in triage and is negative.  Discussed with patient work up for abdominal pain including labs, UA, pelvic exam. She is eager to be discharged home because she has to pick up her daughter form the sitter. She does not have time to wait for lab work or the pelvic exam. Given her well appearing exam, negative pregnancy test, she is stable to be discharged with gyn follow up. Discussed the risks of not having further testing and patient is aware possible adverse events that could happen without further workup.   The patient appears reasonably screened and/or stabilized for discharge and I doubt any other medical condition or other [facility] requiring further screening, evaluation, or treatment in the ED at this time prior to discharge. The patient is safe for discharge with strict return precautions discussed. Patient appears reliable for follow up.  Portions of this note were generated with Lobbyist. Dictation errors may occur despite best attempts at proofreading.   Final Clinical Impression(s) / ED Diagnoses Final diagnoses:  Vaginal bleeding    Rx / DC Orders ED Discharge Orders    None       Cherre Robins, PA-C 05/04/19 1735    Wyvonnia Dusky, MD 05/05/19 [phone]

## 2019-05-04 NOTE — Discharge Instructions (Addendum)
You have been seen today for vaginal bleeding. Please read and follow all provided instructions. Return to the emergency room for worsening condition or new concerning symptoms.    Your pregnancy test was negative. As we discussed it could be too early to be positive.  1. Medications:  Continue usual home medications Take medications as prescribed. Please review all of the medicines and only take them if you do not have an allergy to them.   2. Treatment: rest, drink plenty of fluids  3. Follow Up:  Please follow up with your gyn doctor.  The next available appointment as we discussed.  When you call you should say it is an emergency room follow-up visit.   It is also a possibility that you have an allergic reaction to any of the medicines that you have been prescribed - Everybody reacts differently to medications and while MOST people have no trouble with most medicines, you may have a reaction such as nausea, vomiting, rash, swelling, shortness of breath. If this is the case, please stop taking the medicine immediately and contact your physician.  ?

## 2020-09-28 DIAGNOSIS — Z20822 Contact with and (suspected) exposure to covid-19: Secondary | ICD-10-CM | POA: Diagnosis not present

## 2021-02-04 ENCOUNTER — Emergency Department (HOSPITAL_COMMUNITY)
Admission: EM | Admit: 2021-02-04 | Discharge: 2021-02-05 | Disposition: A | Discharge status: Home / Self Care | Payer: 59 | Attending: Emergency Medicine | Admitting: Emergency Medicine

## 2021-02-04 DIAGNOSIS — Z87891 Personal history of nicotine dependence: Secondary | ICD-10-CM | POA: Diagnosis not present

## 2021-02-04 DIAGNOSIS — M79652 Pain in left thigh: Secondary | ICD-10-CM | POA: Insufficient documentation

## 2021-02-04 DIAGNOSIS — J45909 Unspecified asthma, uncomplicated: Secondary | ICD-10-CM | POA: Diagnosis not present

## 2021-02-04 DIAGNOSIS — R42 Dizziness and giddiness: Secondary | ICD-10-CM | POA: Diagnosis not present

## 2021-02-04 DIAGNOSIS — N939 Abnormal uterine and vaginal bleeding, unspecified: Secondary | ICD-10-CM | POA: Insufficient documentation

## 2021-02-04 LAB — CBC WITH DIFFERENTIAL/PLATELET
Abs Immature Granulocytes: 0.03 10*3/uL (ref 0.00–0.07)
Basophils Absolute: 0 10*3/uL (ref 0.0–0.1)
Basophils Relative: 0 %
Eosinophils Absolute: 0.3 10*3/uL (ref 0.0–0.5)
Eosinophils Relative: 5 %
HCT: 46 % (ref 36.0–46.0)
Hemoglobin: 15.2 g/dL — ABNORMAL HIGH (ref 12.0–15.0)
Immature Granulocytes: 1 %
Lymphocytes Relative: 30 %
Lymphs Abs: 2 10*3/uL (ref 0.7–4.0)
MCH: 32.4 pg (ref 26.0–34.0)
MCHC: 33 g/dL (ref 30.0–36.0)
MCV: 98.1 fL (ref 80.0–100.0)
Monocytes Absolute: 0.5 10*3/uL (ref 0.1–1.0)
Monocytes Relative: 7 %
Neutro Abs: 3.8 10*3/uL (ref 1.7–7.7)
Neutrophils Relative %: 57 %
Platelets: 259 10*3/uL (ref 150–400)
RBC: 4.69 MIL/uL (ref 3.87–5.11)
RDW: 12.6 % (ref 11.5–15.5)
WBC: 6.6 10*3/uL (ref 4.0–10.5)
nRBC: 0 % (ref 0.0–0.2)

## 2021-02-04 LAB — URINALYSIS, ROUTINE W REFLEX MICROSCOPIC
Bilirubin Urine: NEGATIVE
Glucose, UA: NEGATIVE mg/dL
Ketones, ur: NEGATIVE mg/dL
Leukocytes,Ua: NEGATIVE
Nitrite: NEGATIVE
Protein, ur: NEGATIVE mg/dL
Specific Gravity, Urine: 1.016 (ref 1.005–1.030)
pH: 8 (ref 5.0–8.0)

## 2021-02-04 LAB — BASIC METABOLIC PANEL
Anion gap: 7 (ref 5–15)
BUN: 7 mg/dL (ref 6–20)
CO2: 29 mmol/L (ref 22–32)
Calcium: 8.7 mg/dL — ABNORMAL LOW (ref 8.9–10.3)
Chloride: 107 mmol/L (ref 98–111)
Creatinine, Ser: 0.84 mg/dL (ref 0.44–1.00)
GFR, Estimated: >60 mL/min (ref >60)
Glucose, Bld: 97 mg/dL (ref 70–99)
Potassium: 4.2 mmol/L (ref 3.5–5.1)
Sodium: 143 mmol/L (ref 135–145)

## 2021-02-04 LAB — PREGNANCY, URINE: Preg Test, Ur: NEGATIVE

## 2021-02-04 NOTE — ED Triage Notes (Signed)
Pt here d/t vaginal bleeding X1 month. Some cramping  intermittent.

## 2021-02-04 NOTE — ED Provider Notes (Signed)
Emergency Medicine Provider Triage Evaluation Note  Cheryl Henry , a 25 y.o. female  was evaluated in triage.  Pt complains of vaginal bleeding x1 month.  Came in today because started having bilateral lower abdomen and pelvis.  Had an episode where she felt lightheaded earlier today and had a presyncopal event.  Did not actually lose consciousness, no chest pain or shortness of breath.  Nausea in the morning, no vomiting.  History of previous C-section, no other abdominal procedures.  She is not on any birth control.  Not on any blood thinners..  Review of Systems  Positive: Vaginal bleeding, abdominal cramping, presyncope Negative: Chest pain, shortness of breath  Physical Exam  BP (!) 141/83 (BP Location: Left Arm)   Pulse (!) 120   Temp 97.7 F (36.5 C)   Resp 18   SpO2 100%  Gen:   Awake, no distress   Resp:  Normal effort  MSK:   Moves extremities without difficulty  Other:  Abdomen is soft, not reproducibly tender.  She is tachycardic, radial pulse 2+ bilaterally  Medical Decision Making  Medically screening exam initiated at 6:36 PM.  Appropriate orders placed.  Vickie Epley was informed that the remainder of the evaluation will be completed by another provider, this initial triage assessment does not replace that evaluation, and the importance of remaining in the ED until their evaluation is complete.  Vaginal bleeding   Theron Arista, PA-C 02/04/21 1837    Ernie Avena, MD 02/04/21 [phone]

## 2021-02-05 NOTE — ED Provider Notes (Signed)
[facility] EMERGENCY DEPARTMENT Provider Note   CSN: [id number] Arrival date & time: 02/04/21  1733     History Chief Complaint  Patient presents with   Vaginal Bleeding    Cheryl Henry is a 25 y.o. female.  Patient presents to the emergency department with a chief complaint of intermittent vaginal bleeding and cramping for the past month.  She reports that her symptoms worsened today.  She has used a few pads and a few tampons today.  She states that she felt slightly lightheaded earlier, but did not pass out.  She denies any chest pain or shortness of breath.  Denies any treatments prior to arrival.  She has not discussed her symptoms with her OB/GYN.  Additionally, patient complains of some intermittent left thigh pain, which she states radiates down her leg.  This is not new.  The history is provided by the patient. No language interpreter was used.      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   Acetaminophen overdose 06/15/2012   Phenytoin overdose 06/15/2012   Asthma 10/12/2008    Past Surgical History:  Procedure Laterality Date   CESAREAN SECTION N/A 07/25/2018   Procedure: CESAREAN SECTION;  Surgeon: Hermina Staggers, MD;  Location: [facility] LD ORS;  Service: Obstetrics;  Laterality: N/A;   MYRINGOTOMY WITH TUBE PLACEMENT     TONSILLECTOMY       OB History     Gravida  3   Para  0   Term  0   Preterm  0   AB  2   Living  0      SAB  2   IAB  0   Ectopic  0   Multiple  0   Live Births  0           Family History  Problem Relation Age of Onset   Bipolar disorder Sister    Heart disease Maternal Grandmother        chf   Diabetes Maternal Grandmother    Heart disease Maternal Grandfather     Social History   Tobacco Use    Smoking status: Former    Types: Cigarettes   Smokeless tobacco: Never  Vaping Use   Vaping Use: Never used  Substance Use Topics   Alcohol use: Not Currently   Drug use: No    Home Medications Prior to Admission medications   Medication Sig Start Date End Date Taking? Authorizing Provider  ferrous sulfate 325 (65 FE) MG tablet Take 1 tablet (325 mg total) by mouth 2 (two) times daily with a meal. Patient not taking: Reported on 08/31/2018 07/28/18   Hermina Staggers, MD  ibuprofen (ADVIL) 800 MG tablet Take 1 tablet (800 mg total) by mouth every 8 (eight) hours. Patient not taking: Reported on 08/31/2018 07/28/18   Hermina Staggers, MD  oxyCODONE (OXY IR/ROXICODONE) 5 MG immediate release tablet Take 1-2 tablets (5-10 mg total) by mouth every 4 (four) hours as needed for moderate pain. Patient not taking: Reported on 08/31/2018 07/28/18   Hermina Staggers, MD  Prenatal MV & Min w/FA-DHA ([employer] PRENATAL GUMMY PO) Take 2 tablets by mouth daily.    [provider]  Allergies    Prednisone  Review of Systems   Review of Systems  All other systems reviewed and are negative.  Physical Exam Updated Vital Signs BP 120/77 (BP Location: Left Arm)   Pulse 83   Temp 97.8 F (36.6 C) (Oral)   Resp 15   SpO2 100%   Physical Exam Vitals and nursing note reviewed.  Constitutional:      General: She is not in acute distress.    Appearance: She is well-developed.  HENT:     Head: Normocephalic and atraumatic.  Eyes:     Conjunctiva/sclera: Conjunctivae normal.  Cardiovascular:     Rate and Rhythm: Normal rate and regular rhythm.     Heart sounds: No murmur heard. Pulmonary:     Effort: Pulmonary effort is normal. No respiratory distress.     Breath sounds: Normal breath sounds.  Abdominal:     Palpations: Abdomen is soft.     Tenderness: There is no abdominal tenderness.  Musculoskeletal:        General: No swelling.     Cervical back: Neck supple.  Skin:    General:  Skin is warm and dry.     Capillary Refill: Capillary refill takes less than 2 seconds.  Neurological:     Mental Status: She is alert.  Psychiatric:        Mood and Affect: Mood normal.    ED Results / Procedures / Treatments   Labs (all labs ordered are listed, but only abnormal results are displayed) Labs Reviewed  BASIC METABOLIC PANEL - Abnormal; Notable for the following components:      Result Value   Calcium 8.7 (*)    All other components within normal limits  CBC WITH DIFFERENTIAL/PLATELET - Abnormal; Notable for the following components:   Hemoglobin 15.2 (*)    All other components within normal limits  URINALYSIS, ROUTINE W REFLEX MICROSCOPIC - Abnormal; Notable for the following components:   Hgb urine dipstick MODERATE (*)    Bacteria, UA RARE (*)    All other components within normal limits  PREGNANCY, URINE    EKG None  Radiology No results found.  Procedures Procedures   Medications Ordered in ED Medications - No data to display  ED Course  I have reviewed the triage vital signs and the nursing notes.  Pertinent labs & imaging results that were available during my care of the patient were reviewed by me and considered in my medical decision making (see chart for details).    MDM Rules/Calculators/A&P                           Patient here with intermittent vaginal bleeding and cramping for the past month.  Hemoglobin is stable.  Vitals are stable.  Patient is in no acute distress.  I did offer ultrasound to evaluate for uterine fibroids, which I suspect is the cause of her irregular bleeding and cramping.  Pregnancy test is negative.  Patient ultimately declines the ultrasound.  I also offered to treat her with Megace to see if that would stop her bleeding, but she states that she would rather follow-up with her OB/GYN for further discussion about treatment/intervention.  Left thigh pain sounds like sciatica.  Recommend PCP follow-up.  Patient  appears stable for discharge. Final Clinical Impression(s) / ED Diagnoses Final diagnoses:  Vaginal bleeding    Rx / DC Orders ED Discharge Orders     None  Roxy Horseman, PA-C 02/05/21 0327    Glynn Octave, MD 02/05/21 [phone]

## 2021-02-05 NOTE — Discharge Instructions (Signed)
Take ibuprofen for your pain.  If you have bleeding that fills 1 pad or tampon every hour for multiple hours, you should return to the emergency department.  I recommend that you follow-up with your OB/GYN.  You may need an ultrasound for further evaluation.

## 2021-02-06 ENCOUNTER — Telehealth: Payer: Self-pay

## 2021-02-06 NOTE — Telephone Encounter (Signed)
Transition Care Management Follow-up Telephone Call Date of discharge and from where: 02/05/2021 from [facility] How have you been since you were released from the hospital? Pt stated that the bleeding is not as bad at yesterday but she is still bleeding more than her usual cycle. Pt stated that she is experiencing cramping today.  Notes from ED visit: Patient ultimately declines the ultrasound.  I also offered to treat her with Megace to see if that would stop her bleeding, but she states that she would rather follow-up with her OB/GYN for further discussion about treatment/intervention. Any questions or concerns? No  Items Reviewed: Did the pt receive and understand the discharge instructions provided? Yes  Medications obtained and verified? Yes  Other? No  Any new allergies since your discharge? No  Dietary orders reviewed? No Do you have support at home? Yes   Functional Questionnaire: (I = Independent and D = Dependent) ADLs: I  Bathing/Dressing- I  Meal Prep- I  Eating- I  Maintaining continence- I  Transferring/Ambulation- I  Managing Meds- I  Follow up appointments reviewed:  PCP Hospital f/u appt confirmed? No   Specialist Hospital f/u appt confirmed? No   Are transportation arrangements needed? No  If their condition worsens, is the pt aware to call PCP or go to the Emergency Dept.? Yes Was the patient provided with contact information for the PCP's office or ED? Yes Was to pt encouraged to call back with questions or concerns? Yes

## 2021-10-07 ENCOUNTER — Ambulatory Visit: Payer: Medicaid Other | Admitting: Nurse Practitioner

## 2023-12-08 ENCOUNTER — Encounter (HOSPITAL_COMMUNITY): Payer: Self-pay | Admitting: *Deleted

## 2023-12-08 ENCOUNTER — Inpatient Hospital Stay (HOSPITAL_COMMUNITY)
Admission: AD | Admit: 2023-12-08 | Discharge: 2023-12-08 | Disposition: A | Discharge status: Home / Self Care | Attending: Obstetrics and Gynecology | Admitting: Obstetrics and Gynecology

## 2023-12-08 DIAGNOSIS — Z3202 Encounter for pregnancy test, result negative: Secondary | ICD-10-CM | POA: Insufficient documentation

## 2023-12-08 DIAGNOSIS — Z3A01 Less than 8 weeks gestation of pregnancy: Secondary | ICD-10-CM | POA: Diagnosis not present

## 2023-12-08 DIAGNOSIS — N920 Excessive and frequent menstruation with regular cycle: Secondary | ICD-10-CM

## 2023-12-08 DIAGNOSIS — N939 Abnormal uterine and vaginal bleeding, unspecified: Secondary | ICD-10-CM | POA: Insufficient documentation

## 2023-12-08 LAB — HCG, QUANTITATIVE, PREGNANCY: hCG, Beta Chain, Quant, S: 25 m[IU]/mL — ABNORMAL HIGH (ref <5)

## 2023-12-08 LAB — POCT PREGNANCY, URINE: Preg Test, Ur: NEGATIVE

## 2023-12-08 NOTE — MAU Provider Note (Signed)
 History     CSN: [id number]  Arrival date and time: 12/08/23 1809   None     Chief Complaint  Patient presents with   Abdominal Pain   Vaginal Bleeding   Cheryl Henry , a  28 y.o. [id number] at Unknown presents to MAU with complaints of 8 positive pregnancy test at home. Patient states that she has been taking several test throughout the day after some light cramping and spotting. She states that the spotting is only with wiping and is light pink. Currently states the cramping is mild and denies attempting to relieve symptoms. She states LMP was Aug 18th and that she normally has a regular period. Her UPT was negative here in MAU.           OB History     Gravida  4   Para  1   Term  1   Preterm  0   AB  2   Living  1      SAB  2   IAB  0   Ectopic  0   Multiple  0   Live Births  1           Past Medical History:  Diagnosis Date   Anxiety    Asthma    as child    Depression    doing ok   Infection    UTI    Past Surgical History:  Procedure Laterality Date   CESAREAN SECTION N/A 07/25/2018   Procedure: CESAREAN SECTION;  Surgeon: Lorence Ozell CROME, MD;  Location: [facility];  Service: Obstetrics;  Laterality: N/A;   MYRINGOTOMY WITH TUBE PLACEMENT     TONSILLECTOMY      Family History  Problem Relation Age of Onset   Bipolar disorder Sister    Heart disease Maternal Grandmother        chf   Diabetes Maternal Grandmother    Heart disease Maternal Grandfather     Social History   Tobacco Use   Smoking status: Former    Types: Cigarettes   Smokeless tobacco: Never  Vaping Use   Vaping status: Never Used  Substance Use Topics   Alcohol use: Not Currently   Drug use: No    Allergies:  Allergies  Allergen Reactions   Prednisone Rash    Medications Prior to Admission  Medication Sig Dispense Refill Last Dose/Taking   ferrous sulfate  325 (65 FE) MG tablet Take 1 tablet (325 mg total) by mouth 2 (two) times daily with a meal.  (Patient not taking: Reported on 08/31/2018) 60 tablet 3    ibuprofen  (ADVIL ) 800 MG tablet Take 1 tablet (800 mg total) by mouth every 8 (eight) hours. (Patient not taking: Reported on 08/31/2018) 30 tablet 0    oxyCODONE  (OXY IR/ROXICODONE ) 5 MG immediate release tablet Take 1-2 tablets (5-10 mg total) by mouth every 4 (four) hours as needed for moderate pain. (Patient not taking: Reported on 08/31/2018) 24 tablet 0    Prenatal MV & Min w/FA-DHA ([employer] PRENATAL GUMMY PO) Take 2 tablets by mouth daily.       Review of Systems  Constitutional:  Negative for chills, fatigue and fever.  Eyes:  Negative for pain and visual disturbance.  Respiratory:  Negative for apnea, shortness of breath and wheezing.   Cardiovascular:  Negative for chest pain and palpitations.  Gastrointestinal:  Negative for abdominal pain, constipation, diarrhea, nausea and vomiting.  Genitourinary:  Negative for difficulty urinating, dysuria, pelvic pain, vaginal  bleeding, vaginal discharge and vaginal pain.  Musculoskeletal:  Negative for back pain.  Neurological:  Negative for seizures, weakness and headaches.  Psychiatric/Behavioral:  Negative for suicidal ideas.    Physical Exam   Blood pressure 131/88, pulse 88, temperature 98.5 F (36.9 C), resp. rate 17, height 5' 2 (1.575 m), weight 117.5 kg, SpO2 100%, unknown if currently breastfeeding.  Physical Exam Vitals and nursing note reviewed.  Constitutional:      General: She is not in acute distress.    Appearance: Normal appearance.  HENT:     Head: Normocephalic.  Pulmonary:     Effort: Pulmonary effort is normal.  Musculoskeletal:     Cervical back: Normal range of motion.  Skin:    General: Skin is warm and dry.  Neurological:     Mental Status: She is alert and oriented to person, place, and time.  Psychiatric:        Mood and Affect: Mood normal.     MAU Course  Procedures Orders Placed This Encounter  Procedures   hCG, quantitative, pregnancy    Pregnancy, urine POC   Discharge patient Discharge disposition: 01-Home or Self Care; Discharge patient date: 12/08/2023   Results for orders placed or performed during the hospital encounter of 12/08/23 (from the past 24 hours)  Pregnancy, urine POC     Status: None   Collection Time: 12/08/23  6:51 PM  Result Value Ref Range   Preg Test, Ur NEGATIVE NEGATIVE  hCG, quantitative, pregnancy     Status: Abnormal   Collection Time: 12/08/23 10:25 PM  Result Value Ref Range   hCG, Beta Chain, Quant, S 25 (H) <5 mIU/mL    MDM - UPT negative in MAU  - HCG level drawn. Patient stable at this time and desires to be notified of results via MyChart. CNM agreeable to plan of care.  - plan for discharge   Assessment and Plan   1. Urine pregnancy test negative   2. Spotting   3. [redacted] weeks gestation of pregnancy    - Reviewed that CNM will notify patient of result via MyChart.  - Discussed possibilities of false positives and early testing in pregnancy.  - Regardless recommendation for repeat testing in 48 hours if quant was positive, and patient agreeable to plan of care. - Reviewed worsening signs and return precautions.  - Patient discharged home in stable condition and may return to MAU as needed.   Claris CHRISTELLA Cedar, MSN CNM  12/08/2023, 10:45 PM

## 2023-12-08 NOTE — Progress Notes (Signed)
 Written and verbal d/c instructions given by Claris Cedar CNM and pt then d/c home. D/C by CNM

## 2023-12-08 NOTE — MAU Note (Signed)
 Cheryl Henry is a 28 y.o. at Unknown here in MAU reporting taking 7 upts several days ago and all positive and 2 today that were positive. Some cramping and spotting today. Has had 2 SABs so concerned  LMP: 10/24/23 Onset of complaint: today Pain score: 6 Vitals:   12/08/23 2044 12/08/23 2045  BP:  131/88  Pulse: 88   Resp: 17   Temp: 98.5 F (36.9 C)   SpO2: 100%      FHT: na  Lab orders placed from triage: none

## 2023-12-09 ENCOUNTER — Ambulatory Visit: Payer: Self-pay | Admitting: Certified Nurse Midwife

## 2023-12-10 ENCOUNTER — Other Ambulatory Visit: Payer: Self-pay

## 2023-12-10 DIAGNOSIS — N920 Excessive and frequent menstruation with regular cycle: Secondary | ICD-10-CM

## 2023-12-11 ENCOUNTER — Encounter (HOSPITAL_COMMUNITY): Payer: Self-pay | Admitting: *Deleted

## 2023-12-11 ENCOUNTER — Inpatient Hospital Stay (HOSPITAL_COMMUNITY)
Admission: AD | Admit: 2023-12-11 | Discharge: 2023-12-11 | Disposition: A | Discharge status: Home / Self Care | Source: Ambulatory Visit | Attending: Obstetrics and Gynecology | Admitting: Obstetrics and Gynecology

## 2023-12-11 DIAGNOSIS — R7989 Other specified abnormal findings of blood chemistry: Secondary | ICD-10-CM

## 2023-12-11 DIAGNOSIS — O26851 Spotting complicating pregnancy, first trimester: Secondary | ICD-10-CM | POA: Diagnosis present

## 2023-12-11 DIAGNOSIS — N939 Abnormal uterine and vaginal bleeding, unspecified: Secondary | ICD-10-CM

## 2023-12-11 DIAGNOSIS — Z3A01 Less than 8 weeks gestation of pregnancy: Secondary | ICD-10-CM | POA: Insufficient documentation

## 2023-12-11 LAB — BETA HCG QUANT (REF LAB): hCG Quant: 29 m[IU]/mL

## 2023-12-11 LAB — HCG, QUANTITATIVE, PREGNANCY: hCG, Beta Chain, Quant, S: 23 m[IU]/mL — ABNORMAL HIGH (ref <5)

## 2023-12-11 NOTE — MAU Note (Signed)
 Pt says she came here Tuesday- for cramping-  HPT - positive. We did labs - HCG- 25. Yesterday went to clinic- HCG- 29 . Today at 3pm- VB- light pink when wiping.  Says same now, Cramping- started at 2 pm - 7/10  - no meds  Now- 1/10

## 2023-12-11 NOTE — MAU Provider Note (Signed)
 [name] is a 28 y.o. [phone] patient who presents to MAU today with complaint of patient having a home UPT positive.  She was seen Tuesday, 12/10/2023 with a negative UPT here in the MAU serum quant was done which resulted in an hCG level of 25.  She was told to follow-up in 48 hours for repeat quant she went to the office yesterday and had a repeat hCG quant at 29.  Patient reports today at 3 PM she started having light pinkish bleeding when wiping after using the bathroom and reports some mild lower abdominal cramping that started at 2 PM.  She states she is currently has no active vaginal bleeding or cramping and was here for another hCG quant    O BP 126/88 (BP Location: Right Arm)   Pulse (!) 103   Temp 98.1 F (36.7 C) (Oral)   Resp 12   Ht 5' 2 (1.575 m)   Wt 117.1 kg   LMP 10/29/2023   BMI 47.23 kg/m  Physical Exam Vitals and nursing note reviewed.  Constitutional:      General: She is not in acute distress.    Appearance: She is well-developed. She is obese. She is not ill-appearing.  HENT:     Head: Normocephalic.  Cardiovascular:     Rate and Rhythm: Normal rate.  Pulmonary:     Effort: Pulmonary effort is normal.  Abdominal:     Palpations: Abdomen is soft.  Musculoskeletal:        General: Normal range of motion.     Cervical back: Normal range of motion.  Skin:    General: Skin is warm.  Neurological:     Mental Status: She is alert and oriented to person, place, and time.  Psychiatric:        Behavior: Behavior normal.     MDM  Vaginal bleeding/ abdominal pain in early pregnancy  HCG Levels 9/23: 25 9/24: 29 9/26: 23  Inappropriate rise of hCG levels in pregnancy.  Likely a biochemical pregnancy versus a blighted ovum will need follow-up in 48 hours or Monday, 12/14/2023 for repeat hCG level.  Patient counseled this will likely be a failed pregnancy but will need to continue to trend hCG levels.  Differential diagnosis considered for 1st  trimester vaginal bleeding includes but is not limited to: ectopic pregnancy, complete spontaneous abortion, incomplete abortion, missed abortion, threatened abortion, embryonic/fetal demise, cervical insufficiency, cervical or vaginal disorder    Orders Placed This Encounter  Procedures   hCG, quantitative, pregnancy    Standing Status:   Standing    Number of Occurrences:   1   Discharge patient Discharge disposition: 01-Home or Self Care; Discharge patient date: 12/11/2023    Standing Status:   Standing    Number of Occurrences:   1    Discharge disposition:   01-Home or Self Care [1]    Discharge patient date:   12/11/2023      Results for orders placed or performed during the hospital encounter of 12/11/23 (from the past 24 hours)  hCG, quantitative, pregnancy     Status: Abnormal   Collection Time: 12/11/23  7:14 PM  Result Value Ref Range   hCG, Beta Chain, Quant, S 23 (H) <5 mIU/mL     I have reviewed the patient chart and performed the physical exam.  Counseling and education provided and patient agreeable  with plan as described below. Verbalized understanding.    ASSESSMENT Medical screening exam complete  Elevated serum hCG  Vaginal spotting    PLAN Future Appointments  Date Time Provider Department Center  12/14/2023 11:20 AM [facility]    Discharge from MAU in stable condition  See AVS for full description of educational information and instructions provided to the patient at time of discharge  Warning signs for worsening condition that would warrant emergency follow-up discussed Patient may return to MAU as needed   Littie Olam LABOR, NP 12/11/2023 8:22 PM   This chart was dictated using voice recognition software, Dragon. Despite the best efforts of this provider to proofread and correct errors, errors may still occur which can change documentation meaning.

## 2023-12-14 ENCOUNTER — Ambulatory Visit: Payer: Self-pay
